# Patient Record
Sex: Female | Born: 1985
Health system: Southern US, Community
[De-identification: ages and names within clinical notes are randomized; demographics above are authoritative.]

## PROBLEM LIST (undated history)

## (undated) DIAGNOSIS — M40209 Unspecified kyphosis, site unspecified: Secondary | ICD-10-CM

## (undated) DIAGNOSIS — N83201 Unspecified ovarian cyst, right side: Secondary | ICD-10-CM

## (undated) DIAGNOSIS — R55 Syncope and collapse: Secondary | ICD-10-CM

## (undated) DIAGNOSIS — R5600 Simple febrile convulsions: Secondary | ICD-10-CM

## (undated) DIAGNOSIS — N83202 Unspecified ovarian cyst, left side: Secondary | ICD-10-CM

## (undated) DIAGNOSIS — C801 Malignant (primary) neoplasm, unspecified: Secondary | ICD-10-CM

## (undated) DIAGNOSIS — S060X9A Concussion with loss of consciousness of unspecified duration, initial encounter: Secondary | ICD-10-CM

## (undated) DIAGNOSIS — F82 Specific developmental disorder of motor function: Secondary | ICD-10-CM

## (undated) DIAGNOSIS — N390 Urinary tract infection, site not specified: Secondary | ICD-10-CM

## (undated) DIAGNOSIS — F809 Developmental disorder of speech and language, unspecified: Secondary | ICD-10-CM

## (undated) HISTORY — DX: Developmental disorder of speech and language, unspecified: F80.9

## (undated) HISTORY — DX: Specific developmental disorder of motor function: F82

## (undated) HISTORY — DX: Simple febrile convulsions: R56.00

## (undated) HISTORY — DX: Unspecified kyphosis, site unspecified: M40.209

## (undated) HISTORY — DX: Syncope and collapse: R55

## (undated) HISTORY — PX: WISDOM TOOTH EXTRACTION: SHX21

---
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
## 2007-04-17 ENCOUNTER — Ambulatory Visit: Payer: Self-pay | Admitting: Internal Medicine

## 2007-04-19 ENCOUNTER — Encounter: Admission: RE | Admit: 2007-04-19 | Discharge: 2007-05-05 | Payer: Self-pay | Admitting: Orthopaedic Surgery

## 2007-05-23 ENCOUNTER — Telehealth: Payer: Self-pay | Admitting: Internal Medicine

## 2007-07-10 ENCOUNTER — Telehealth: Payer: Self-pay | Admitting: *Deleted

## 2007-09-01 ENCOUNTER — Telehealth: Payer: Self-pay | Admitting: *Deleted

## 2007-11-21 ENCOUNTER — Telehealth: Payer: Self-pay | Admitting: Internal Medicine

## 2007-11-21 ENCOUNTER — Telehealth: Payer: Self-pay | Admitting: *Deleted

## 2008-08-06 ENCOUNTER — Telehealth: Payer: Self-pay | Admitting: Internal Medicine

## 2009-01-28 ENCOUNTER — Ambulatory Visit (HOSPITAL_COMMUNITY): Admission: RE | Admit: 2009-01-28 | Discharge: 2009-01-28 | Payer: Self-pay | Admitting: Neurology

## 2009-01-28 ENCOUNTER — Encounter: Payer: Self-pay | Admitting: Internal Medicine

## 2009-02-12 ENCOUNTER — Encounter: Payer: Self-pay | Admitting: Internal Medicine

## 2009-02-14 ENCOUNTER — Ambulatory Visit: Payer: Self-pay | Admitting: Internal Medicine

## 2009-02-14 DIAGNOSIS — R519 Headache, unspecified: Secondary | ICD-10-CM | POA: Insufficient documentation

## 2009-02-14 DIAGNOSIS — R55 Syncope and collapse: Secondary | ICD-10-CM | POA: Insufficient documentation

## 2009-02-14 DIAGNOSIS — R51 Headache: Secondary | ICD-10-CM | POA: Insufficient documentation

## 2009-09-01 ENCOUNTER — Telehealth: Payer: Self-pay | Admitting: Internal Medicine

## 2009-09-11 ENCOUNTER — Encounter: Payer: Self-pay | Admitting: Internal Medicine

## 2010-07-06 ENCOUNTER — Ambulatory Visit: Payer: Self-pay | Admitting: Internal Medicine

## 2010-07-06 ENCOUNTER — Other Ambulatory Visit: Admission: RE | Admit: 2010-07-06 | Discharge: 2010-07-06 | Payer: Self-pay | Admitting: Internal Medicine

## 2010-07-06 DIAGNOSIS — R030 Elevated blood-pressure reading, without diagnosis of hypertension: Secondary | ICD-10-CM | POA: Insufficient documentation

## 2010-07-06 LAB — CONVERTED CEMR LAB: HIV: NONREACTIVE

## 2010-07-07 LAB — CONVERTED CEMR LAB
ALT: 15 units/L (ref 0–35)
AST: 21 units/L (ref 0–37)
Albumin: 3.9 g/dL (ref 3.5–5.2)
Alkaline Phosphatase: 59 units/L (ref 39–117)
BUN: 20 mg/dL (ref 6–23)
Basophils Absolute: 0.1 10*3/uL (ref 0.0–0.1)
Basophils Relative: 1.2 % (ref 0.0–3.0)
Bilirubin, Direct: 0.1 mg/dL (ref 0.0–0.3)
CO2: 28 meq/L (ref 19–32)
Calcium: 8.9 mg/dL (ref 8.4–10.5)
Chloride: 99 meq/L (ref 96–112)
Cholesterol: 155 mg/dL (ref 0–200)
Creatinine, Ser: 0.8 mg/dL (ref 0.4–1.2)
Eosinophils Absolute: 0.1 10*3/uL (ref 0.0–0.7)
Eosinophils Relative: 1.5 % (ref 0.0–5.0)
GFR calc non Af Amer: 100.62 mL/min (ref >60)
Glucose, Bld: 81 mg/dL (ref 70–99)
HCT: 39.7 % (ref 36.0–46.0)
HDL: 65.2 mg/dL (ref >39.00)
Hemoglobin: 13.8 g/dL (ref 12.0–15.0)
LDL Cholesterol: 83 mg/dL (ref 0–99)
Lymphocytes Relative: 27.7 % (ref 12.0–46.0)
Lymphs Abs: 1.9 10*3/uL (ref 0.7–4.0)
MCHC: 34.8 g/dL (ref 30.0–36.0)
MCV: 92.9 fL (ref 78.0–100.0)
Monocytes Absolute: 0.6 10*3/uL (ref 0.1–1.0)
Monocytes Relative: 8.4 % (ref 3.0–12.0)
Neutro Abs: 4.3 10*3/uL (ref 1.4–7.7)
Neutrophils Relative %: 61.2 % (ref 43.0–77.0)
Platelets: 158 10*3/uL (ref 150.0–400.0)
Potassium: 3.8 meq/L (ref 3.5–5.1)
RBC: 4.27 M/uL (ref 3.87–5.11)
RDW: 13.2 % (ref 11.5–14.6)
Sodium: 139 meq/L (ref 135–145)
TSH: 0.65 microintl units/mL (ref 0.35–5.50)
Total Bilirubin: 1.1 mg/dL (ref 0.3–1.2)
Total CHOL/HDL Ratio: 2
Total Protein: 6.2 g/dL (ref 6.0–8.3)
Triglycerides: 33 mg/dL (ref 0.0–149.0)
VLDL: 6.6 mg/dL (ref 0.0–40.0)
WBC: 7 10*3/uL (ref 4.5–10.5)

## 2010-07-09 LAB — CONVERTED CEMR LAB: Pap Smear: NEGATIVE

## 2010-08-01 ENCOUNTER — Ambulatory Visit (HOSPITAL_COMMUNITY): Admission: RE | Admit: 2010-08-01 | Discharge: 2010-08-01 | Payer: Self-pay | Admitting: Emergency Medicine

## 2010-08-02 ENCOUNTER — Encounter: Payer: Self-pay | Admitting: Internal Medicine

## 2010-08-07 ENCOUNTER — Encounter: Payer: Self-pay | Admitting: Internal Medicine

## 2010-08-25 ENCOUNTER — Ambulatory Visit: Payer: Self-pay | Admitting: Internal Medicine

## 2010-08-25 DIAGNOSIS — R1084 Generalized abdominal pain: Secondary | ICD-10-CM | POA: Insufficient documentation

## 2010-08-25 DIAGNOSIS — N83209 Unspecified ovarian cyst, unspecified side: Secondary | ICD-10-CM | POA: Insufficient documentation

## 2010-08-25 DIAGNOSIS — R1031 Right lower quadrant pain: Secondary | ICD-10-CM | POA: Insufficient documentation

## 2010-08-25 LAB — CONVERTED CEMR LAB
Bilirubin Urine: NEGATIVE
Blood in Urine, dipstick: NEGATIVE
Glucose, Urine, Semiquant: NEGATIVE
Ketones, urine, test strip: NEGATIVE
Nitrite: NEGATIVE
Protein, U semiquant: NEGATIVE
Specific Gravity, Urine: 1.01
Urobilinogen, UA: 0.2
pH: 7

## 2010-08-26 ENCOUNTER — Encounter: Payer: Self-pay | Admitting: Internal Medicine

## 2010-10-06 NOTE — Miscellaneous (Signed)
Summary: Initial Evaluation for Treatment/[facility]  Initial Evaluation for Treatment/[facility]   Imported By: Maryln Gottron 09/15/2009 15:33:49  _____________________________________________________________________  External Attachment:    Type:   Image     Comment:   External Document

## 2010-10-06 NOTE — Assessment & Plan Note (Signed)
Summary: CPX (PT WILL COME IN FASTING FOR POTENTIAL LABS) / PAP // RS/...   Vital Signs:  Patient profile:   25 year old female Menstrual status:  regular LMP:     06/22/2010 Height:      69.75 inches Weight:      134 pounds BMI:     19.44 Pulse rate:   78 / minute BP sitting:   110 / 60  (right arm) Cuff size:   regular  Vitals Entered By: Romualdo Bolk, CMA (AAMA) (July 06, 2010 9:57 AM)  Serial Vital Signs/Assessments:  Time      Position  BP       Pulse  Resp  Temp     By                     118/70                         Madelin Headings MD  CC: CPX with pap LMP (date): 06/22/2010 LMP - Character: light Menarche (age onset years): 14   Menses interval (days): 28 Menstrual flow (days): 2 Enter LMP: 06/22/2010   History of Present Illness: Monique Frazier  comesin comes in today  for preventive visit and rx for her OCPs  which is progesterone only because of hx of  neurol symptoms  of a migraine  on combined ocps . she has done well with this.  Generally good health and no major concerns but ssays that when she donates  plasma   recently has had elvated bp readings int he 1130- 140 range and she thinks it is from anxiety when the cuff goes on.   has been normal at other places   No major injuries or illnes since last visit  otherwise.    Preventive Care Screening  Prior Values:    Last Tetanus Booster:  Historical (12/08/1999)   Preventive Screening-Counseling & Management  Alcohol-Tobacco     Alcohol drinks/day: <1     Alcohol type: spirits     Smoking Status: quit     Year Quit: 2008  Caffeine-Diet-Exercise     Caffeine use/day: <1     Does Patient Exercise: yes     Type of exercise: cardio, wts,abds push ups  Hep-HIV-STD-Contraception     Dental Visit-last 6 months yes     Sun Exposure-Excessive: no  Safety-Violence-Falls     Seat Belt Use: yes     Firearms in the Home: no firearms in the home     Smoke Detectors: yes  Current Medications  (verified): 1)  Camila 0.35 Mg Tabs (Norethindrone (Contraceptive)) .Marland Kitchen.. 1 By Mouth Once Daily  Allergies (verified): No Known Drug Allergies  Past History:  Past Medical History: Febrile seizure at age 74.5 years Delayed Motor and speech skills. Premature birth by 4 weeks Twin pregnancy   5# 5oz  Kyphosis  mild  consult Dr Noel Gerold  in past  Syncope with neg ekg and eeg  felt to be  Migraines  to avoid estrogen based  hormones  Past History:  Care Management: Neurology: [facility]    PMH-FH-SH reviewed-no changes except otherwise noted  Social History: working somtime  Biochemist, clinical.  sports Information systems manager. Duplex with  roommate .   cat in  [facility]  No ets or pets.  Ocass caffiene .  Exercising  bike and cardio  and weights.  1-2 night per week  alcohol Seat Belt Use:  yes Dental Care w/in 6 mos.:  yes Sun Exposure-Excessive:  no  Review of Systems  The patient denies anorexia, fever, vision loss, decreased hearing, hoarseness, chest pain, syncope, dyspnea on exertion, prolonged cough, abdominal pain, melena, hematochezia, muscle weakness, transient blindness, difficulty walking, depression, abnormal bleeding, enlarged lymph nodes, and angioedema.    Physical Exam  General:  Well-developed,well-nourished,in no acute distress; alert,appropriate and cooperative throughout examination Head:  Normocephalic and atraumatic without obvious abnormalities. No apparent alopecia or balding. Eyes:  PERRL, EOMs full, conjunctiva clear  Ears:  R ear normal, L ear normal, and no external deformities.   Nose:  no external deformity, no external erythema, and no nasal discharge.   Mouth:  good dentition and pharynx pink and moist.   Neck:  No deformities, masses, or tenderness noted. Chest Wall:  No deformities, masses, or tenderness noted. Breasts:  No mass, nodules, thickening, tenderness, bulging, retraction, inflamation, nipple  discharge or skin changes noted.   Lungs:  Normal respiratory effort, chest expands symmetrically. Lungs are clear to auscultation, no crackles or wheezes.no dullness.   Heart:  Normal rate and regular rhythm. S1 and S2 normal without gallop, murmur, click, rub or other extra sounds.no lifts.   Abdomen:  Bowel sounds positive,abdomen soft and non-tender without masses, organomegaly or hernias noted. Genitalia:  Pelvic Exam:        External: normal female genitalia without lesions or massescreamy discharge grey         Vagina: normal without lesions or masses        Cervix: normal without lesions or masses        Adnexa: normal bimanual exam without masses or fullness        Uterus: normal by palpation        Pap smear: performed Msk:  no joint warmth, no redness over joints, and no joint deformities.   Pulses:  pulses intact without delay   Extremities:  no clubbing cyanosis or edema  Neurologic:  alert & oriented X3, strength normal in all extremities, gait normal, and DTRs symmetrical and normal.   Skin:  turgor normal, color normal, no ecchymoses, and no petechiae.   Cervical Nodes:  No lymphadenopathy noted Axillary Nodes:  No palpable lymphadenopathy Inguinal Nodes:  No significant adenopathy Psych:  Oriented X3, memory intact for recent and remote, normally interactive, good eye contact, and not depressed appearing.  minimally anxious   Impression & Recommendations:  Problem # 1:  PREVENTIVE HEALTH CARE (ICD-V70.0) Discussed nutrition,exercise,diet,healthy weight, vitamin D and calcium.  Orders: TLB-TSH (Thyroid Stimulating Hormone) (84443-TSH) TLB-Hepatic/Liver Function Pnl (80076-HEPATIC) TLB-CBC Platelet - w/Differential (85025-CBCD) TLB-BMP (Basic Metabolic Panel-BMET) (80048-METABOL) TLB-Lipid Panel (80061-LIPID) T-HIV Antibody  (Reflex) (66063-01601) T-RPR (Syphilis) (09323-55732) Venipuncture (20254) Specimen Handling (27062)  Problem # 2:  ROUTINE GYNECOLOGICAL EXAM  (ICD-V72.31) pap done  sti screen  Problem # 3:  MIGRAINE HEADACHE W SYNCOPE (ICD-346.90) to avoid estrogen ocps  per neuro     Problem # 4:  ELEVATED BLOOD PRESSURE WITHOUT DIAGNOSIS OF HYPERTENSION (ICD-796.2) Assessment: New seems to be anxiety situational as bp is good here today    letter done for   patient   Complete Medication List: 1)  Camila 0.35 Mg Tabs (Norethindrone (contraceptive)) .Marland Kitchen.. 1 by mouth once daily  Other Orders: Admin 1st Vaccine (37628) Flu Vaccine 80yrs + (31517)  Patient Instructions: 1)  You will be informed of lab pap  results when available.  2)  yearly checks or as needed. Prescriptions: CAMILA 0.35 MG TABS (NORETHINDRONE (CONTRACEPTIVE)) 1 by mouth once daily  #1 pack x 11   Entered and Authorized by:   Madelin Headings MD   Signed by:   Madelin Headings MD on 07/06/2010   Method used:   Electronically to        [facility]. [phone]* (retail)       [address]       [place], [place]  [address]       Ph: [phone]       Fax: [phone]   RxID:   [id number]    Orders Added: 1)  Admin 1st Vaccine [90471] 2)  Flu Vaccine 22yrs + [44010] 3)  TLB-TSH (Thyroid Stimulating Hormone) [84443-TSH] 4)  TLB-Hepatic/Liver Function Pnl [80076-HEPATIC] 5)  TLB-CBC Platelet - w/Differential [85025-CBCD] 6)  TLB-BMP (Basic Metabolic Panel-BMET) [80048-METABOL] 7)  TLB-Lipid Panel [80061-LIPID] 8)  T-HIV Antibody  (Reflex) [27253-66440] 9)  T-RPR (Syphilis) [34742-59563] 10)  Venipuncture [87564] 11)  Specimen Handling [99000] 12)  Est. Patient 18-39 years [99395] 13)  Est. Patient Level II [33295]   Flu Vaccine Consent Questions     Do you have a history of severe allergic reactions to this vaccine? no    Any prior history of allergic reactions to egg and/or gelatin? no    Do you have a sensitivity to the preservative Thimersol? no    Do you have a past history of Guillan-Barre Syndrome? no    Do you currently  have an acute febrile illness? no    Have you ever had a severe reaction to latex? no    Vaccine information given and explained to patient? yes    Are you currently pregnant? no    Lot Number:[id number]   Exp Date:03/06/2011   Site Given  Left Deltoid IM  .lbflu Romualdo Bolk, CMA (AAMA)  July 06, 2010 10:00 AM

## 2010-10-06 NOTE — Letter (Signed)
Summary: Generic Letter  [place] [facility]  [address] [place], [place] [address]   Phone: [phone]  Fax: [phone]    07/06/2010  Monique Frazier [address] [place], [place]  [address]  The above person is a patient in our practice. She is in good health and her blood pressure is normal.  No medical reason to exclude from blood product donation.        Sincerely,   Berniece Andreas MD

## 2010-10-07 ENCOUNTER — Encounter: Payer: Self-pay | Admitting: Emergency Medicine

## 2010-10-08 NOTE — Assessment & Plan Note (Signed)
Summary: UTI?DM   Vital Signs:  Patient profile:   25 year old female Menstrual status:  regular LMP:     08/11/2010 Weight:      137 pounds Temp:     98.2 degrees F oral Pulse rate:   66 / minute BP sitting:   100 / 60  (right arm) Cuff size:   regular  Vitals Entered By: Romualdo Bolk, CMA (AAMA) (August 25, 2010 4:15 PM) CC: Pt had an ovarian cyst which she took doxy for 10 days. Pt is having Rt lower quad pain that is sharp at time. Pt also had a yeast infection x 2 days over the weekend and went away. Pt didn't treat it. No fever. No burning upon urination. LMP (date): 08/11/2010 LMP - Character: light Menarche (age onset years): 14   Menses interval (days): 28 Menstrual flow (days): 2 Enter LMP: 08/11/2010 Last PAP Result NEGATIVE FOR INTRAEPITHELIAL LESIONS OR MALIGNANCY. BENIGN REACTIVE/REPARATIVE CHANGES.   History of Present Illness: Monique Frazier comes in today  foracute visit but this is a recurrance of .signs  evaluated at urgent care  and [facility] earlier in [place].  was seen in urgen care with abd pain rlq and had elevated wbc 18k pyuria    with neg sti  screen   was rx with antibioitc and noted to have a n ovarian cyst on the right   .   was seen  by gyne who felt  getting bette and to fu if pain recurred.  She was feeling better at end of docy early dec  and was well until a few days ago until the onset again of serious intermittent pain on right lower abdomen.   she had some whit itchy clumpy dc last week but resolved.    No rx for yeast . periods on time and no missed ocps.  she comes in today for a same day appt for eval.  Preventive Screening-Counseling & Management  Alcohol-Tobacco     Alcohol drinks/day: <1     Alcohol type: spirits     Smoking Status: quit     Year Quit: 2008  Caffeine-Diet-Exercise     Caffeine use/day: <1     Does Patient Exercise: yes     Type of exercise: cardio, wts,abds push ups  Current Medications  (verified): 1)  Camila 0.35 Mg Tabs (Norethindrone (Contraceptive)) .Marland Kitchen.. 1 By Mouth Once Daily  Allergies (verified): No Known Drug Allergies  Past History:  Past medical, surgical, family and social histories (including risk factors) reviewed, and no changes noted (except as noted below).  Past Medical History: Reviewed history from 07/06/2010 and no changes required. Febrile seizure at age 33.5 years Delayed Motor and speech skills. Premature birth by 4 weeks Twin pregnancy   5# 5oz  Kyphosis  mild  consult Dr Noel Gerold  in past  Syncope with neg ekg and eeg  felt to be  Migraines  to avoid estrogen based  hormones  Past Surgical History: Reviewed history from 04/20/2007 and no changes required. Denies surgical history  Past History:  Care Management: Neurology: [facility] over thanksgiving weekend [facility]  12  12      Family History: Reviewed history from 02/14/2009 and no changes required. Father: Healthy Mother: Healthy Siblings: Twin Brother- 1 kidney  Neg for SCD arrythmia .   Social History: Reviewed history from 07/06/2010 and no changes required. 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   cat in  [facility]  No ets or pets.  Ocass caffiene .  Exercising  bike and cardio and weights.  1-2 night per week  alcohol   Review of Systems       The patient complains of abdominal pain.  The patient denies anorexia, fever, weight loss, weight gain, vision loss, decreased hearing, prolonged cough, hematochezia, severe indigestion/heartburn, hematuria, incontinence, genital sores, muscle weakness, difficulty walking, abnormal bleeding, enlarged lymph nodes, and angioedema.         see hpi  Physical Exam  General:  Well-developed,well-nourished,in no acute distress; alert,appropriate and cooperative throughout examination Head:  normocephalic and atraumatic.    Eyes:  vision grossly intact, pupils equal, and pupils round.   Neck:  No deformities, masses, or tenderness noted. Lungs:  normal respiratory effort, no intercostal retractions, no accessory muscle use, and normal breath sounds.   Heart:  Normal rate and regular rhythm. S1 and S2 normal without gallop, murmur, click, rub or other extra sounds. Abdomen:  soft, no masses, no guarding, no rigidity, no abdominal hernia, no inguinal hernia, no hepatomegaly, and no splenomegaly.  low right pelvic tenderness no psoas sign   Pulses:  pulses intact without delay   Extremities:  no clubbing cyanosis or edema  Skin:  turgor normal, color normal, no ecchymoses, and no petechiae.   Cervical Nodes:  No lymphadenopathy noted Psych:  Oriented X3, normally interactive, good eye contact, not anxious appearing, and not depressed appearing.   reviewed 2 sets of records available  Impression & Recommendations:  Problem # 1:  ABDOMINAL PAIN, RIGHT LOWER QUADRANT (ICD-789.03)  mild now but  in same placae as prev pain felt to be from cyst but rx for nfection  mild pyuria  will culture and rx empirically  had neg sti check in the past month x 2   Orders: UA Dipstick w/o Micro (automated)  (81003)  Problem # 2:  OVARIAN CYST, RIGHT (ICD-620.2) needs follow up  ..  rec  gyne consult this week and poss  uuuUS imaging  . will do referral   Complete Medication List: 1)  Camila 0.35 Mg Tabs (Norethindrone (contraceptive)) .Marland Kitchen.. 1 by mouth once daily 2)  Ciprofloxacin Hcl 500 Mg Tabs (Ciprofloxacin hcl) .Marland Kitchen.. 1 by mouth two times a day  Other Orders: T-Culture, Urine (63875-64332) Gynecologic Referral (Gyn)  Patient Instructions: 1)  begin antibioitc for possible bladder infection. 2)  can use monistat  if getting  yeast infection  symptoms  vaginal cream or suppository for 3 nights.  3)  Ned appt with the gyne  this week  . call in the meant time  if worse. ok to use the ibuprofen.  in the meantime   Prescriptions: CIPROFLOXACIN HCL 500 MG TABS (CIPROFLOXACIN HCL) 1 by mouth two times a day  #10 x 0   Entered and Authorized by:   Madelin Headings MD   Signed by:   Madelin Headings MD on 08/25/2010   Method used:   Electronically to        [facility]. [phone]* (retail)       [address]       [place], [place]  [address]       Ph: [phone]       Fax: [phone]   RxID:   [id number]    Orders Added: 1)  T-Culture, Urine [[id number]] 2)  Gynecologic Referral [Gyn] 3)  UA Dipstick w/o Micro (automated)  [81003] 4)  Est. Patient Level IV [14782]    Laboratory Results   Urine Tests    Routine Urinalysis   Color: yellow Appearance: Clear Glucose: negative   (Normal Range: Negative) Bilirubin: negative   (Normal Range: Negative) Ketone: negative   (Normal Range: Negative) Spec. Gravity: 1.010   (Normal Range: 1.003-1.035) Blood: negative   (Normal Range: Negative) pH: 7.0   (Normal Range: 5.0-8.0) Protein: negative   (Normal Range: Negative) Urobilinogen: 0.2   (Normal Range: 0-1) Nitrite: negative   (Normal Range: Negative) Leukocyte Esterace: 1+   (Normal Range: Negative)    Comments: Rita Ohara  August 25, 2010 4:04 PM

## 2010-10-08 NOTE — Letter (Signed)
Summary: [facility]  [facility]   Imported By: Maryln Gottron 09/02/2010 14:51:37  _____________________________________________________________________  External Attachment:    Type:   Image     Comment:   External Document

## 2010-10-08 NOTE — Letter (Signed)
Summary: [facility]  [facility]   Imported By: Maryln Gottron 09/02/2010 14:58:35  _____________________________________________________________________  External Attachment:    Type:   Image     Comment:   External Document

## 2011-03-08 ENCOUNTER — Ambulatory Visit (INDEPENDENT_AMBULATORY_CARE_PROVIDER_SITE_OTHER): Payer: BC Managed Care – PPO | Admitting: Internal Medicine

## 2011-03-08 ENCOUNTER — Encounter: Payer: Self-pay | Admitting: Internal Medicine

## 2011-03-08 VITALS — BP 100/60 | HR 72 | Wt 140.0 lb

## 2011-03-08 DIAGNOSIS — Z8679 Personal history of other diseases of the circulatory system: Secondary | ICD-10-CM

## 2011-03-08 DIAGNOSIS — Z8669 Personal history of other diseases of the nervous system and sense organs: Secondary | ICD-10-CM

## 2011-03-08 DIAGNOSIS — Z3009 Encounter for other general counseling and advice on contraception: Secondary | ICD-10-CM

## 2011-03-08 DIAGNOSIS — N926 Irregular menstruation, unspecified: Secondary | ICD-10-CM | POA: Insufficient documentation

## 2011-03-08 DIAGNOSIS — R55 Syncope and collapse: Secondary | ICD-10-CM

## 2011-03-08 LAB — POCT URINALYSIS DIPSTICK
Bilirubin, UA: NEGATIVE
Glucose, UA: NEGATIVE
Ketones, UA: NEGATIVE
Leukocytes, UA: NEGATIVE
Nitrite, UA: NEGATIVE
Protein, UA: NEGATIVE
Spec Grav, UA: 1.025
Urobilinogen, UA: 1
pH, UA: 5.5

## 2011-03-08 LAB — POCT URINE PREGNANCY: Preg Test, Ur: NEGATIVE

## 2011-03-08 NOTE — Progress Notes (Signed)
  Subjective:    Patient ID: Monique Frazier, female    DOB: 08/18/1986, 25 y.o.   MRN: [id number]  HPI Patient comes in today for sda because of problem with vaginal bleeding irreg that is a change from reg pattern.  She has been on Camila because of hx of syncopal type migraine and was re she not take combined estrogen pills.  Brown  And red off and on for the last month and not a lot of cramps.  Very good about taking med  On time  No new partners .Marland Kitchen  Review of Systems UTi couple months ago.   rx with ? Med no vaginal dc abd pain or sores. No fever weight changes Past history family history social history reviewed in the electronic medical record.  To be abstracted ( not done yet )      Objective:   Physical Exam Physical Exam: Vital signs reviewed RSW:NIOE is a well-developed well-nourished alert cooperative  white female who appears her stated age in no acute distress.  HEENT: normocephalic  traumatic , Eyes: PERRL EOM's full, conjunctiva clear, Nares: paten,t no deformity discharge or tenderness., Moist mucous membranes. Dentition in adequate repair. NECK: supple without masses, thyromegaly or bruits. CHEST/PULM:  Clear to auscultation and percussion breath sounds equal no wheeze , rales or rhonchi. No chest wall deformities or tenderness. CV: PMI is nondisplaced, S1 S2 no gallops, murmurs, rubs. Peripheral pulses are full without delay.No JVD .  ABDOMEN: Bowel sounds normal nontender  No guard or rebound, no hepato splenomegal no CVA tenderness.   Extremtities:  No clubbing cyanosis or edema, no acute joint swelling or redness no focal atrophy NEURO:  Oriented x3, cranial nerves 3-12 appear to be intact, no obvious focal weakness,gait within normal limits no abnormal reflexes or asymmetrical SKIN: No acute rashes normal turgor, color, no bruising or petechiae. PSYCH: Oriented, good eye contact, no obvious depression anxiety, cognition and judgment appear normal.        Assessment & Plan:  irreg bleeding   On camila. R/O infection .Marland Kitchen    Low risk    Poss from  prog only pill  Hx of atypical migraines syncopal seizure like  Contraceptive counseling   Consider mirena   Disc with her Gyne .

## 2011-03-08 NOTE — Patient Instructions (Signed)
Will notify you  of labs when available. THis may just be break through bleeding  That will clear up on its own. Consider  Myrena iud. If bleeding continues irreg then see your GYNE. m

## 2011-03-09 LAB — GC/CHLAMYDIA PROBE AMP, URINE
Chlamydia, Swab/Urine, PCR: NEGATIVE
GC Probe Amp, Urine: NEGATIVE

## 2011-03-11 ENCOUNTER — Encounter: Payer: Self-pay | Admitting: Internal Medicine

## 2011-03-11 ENCOUNTER — Telehealth: Payer: Self-pay | Admitting: *Deleted

## 2011-03-11 DIAGNOSIS — Z3009 Encounter for other general counseling and advice on contraception: Secondary | ICD-10-CM | POA: Insufficient documentation

## 2011-03-11 DIAGNOSIS — Z8669 Personal history of other diseases of the nervous system and sense organs: Secondary | ICD-10-CM | POA: Insufficient documentation

## 2011-03-11 NOTE — Telephone Encounter (Signed)
Pt aware of results 

## 2011-03-11 NOTE — Telephone Encounter (Signed)
Left message on machine about results. 

## 2011-03-11 NOTE — Telephone Encounter (Signed)
Message copied by Romualdo Bolk on Thu Mar 11, 2011  8:44 AM ------      Message from: [facility] [place] [facility]      Created: Tue Mar 09, 2011  7:06 PM       Tell Secilia all tests are negative . See her gyne if continuing

## 2011-03-12 ENCOUNTER — Encounter: Payer: Self-pay | Admitting: Internal Medicine

## 2011-03-24 ENCOUNTER — Telehealth: Payer: Self-pay

## 2011-03-24 DIAGNOSIS — R300 Dysuria: Secondary | ICD-10-CM

## 2011-03-24 NOTE — Telephone Encounter (Signed)
Pt c/o UTI. Dysuria and burning. Can not make it to office today. Would like to know if something can be called in for her or does she need to schedule an appointment

## 2011-03-24 NOTE — Telephone Encounter (Signed)
Patient has other rx utis  Rec UA and cx before treatment  Can come any time for spec so we can rx.

## 2011-03-24 NOTE — Telephone Encounter (Signed)
Per Dr. Fabian Sharp- make sure she doesn't have a fever and get her symptoms. Have pt come by tomorrow for a ua and culture. Then we can call something in.

## 2011-03-24 NOTE — Telephone Encounter (Signed)
Left message for patient to call back  

## 2011-03-24 NOTE — Telephone Encounter (Signed)
Pt denies fever and says she will come into the office tomorrow on her lunch break to give a urine sample

## 2011-03-25 ENCOUNTER — Other Ambulatory Visit: Payer: BC Managed Care – PPO

## 2011-03-25 DIAGNOSIS — N390 Urinary tract infection, site not specified: Secondary | ICD-10-CM

## 2011-03-25 LAB — POCT URINALYSIS DIPSTICK
Bilirubin, UA: NEGATIVE
Blood, UA: NEGATIVE
Glucose, UA: NEGATIVE
Ketones, UA: NEGATIVE
Nitrite, UA: NEGATIVE
Protein, UA: NEGATIVE
Spec Grav, UA: 1.005
Urobilinogen, UA: 0.2
pH, UA: 6.5

## 2011-03-26 ENCOUNTER — Telehealth: Payer: Self-pay | Admitting: *Deleted

## 2011-03-26 NOTE — Telephone Encounter (Signed)
Spoke to pt- she is not having any symptoms now. Pt took cranberry pills and that helped it. Pt to call us back if she gets anymore symptoms and we can get a culture then.

## 2011-03-26 NOTE — Telephone Encounter (Signed)
Labs failed to do a urine culture. Need to know what her symptoms are. Left message to call back.

## 2011-03-26 NOTE — Progress Notes (Signed)
See phone note

## 2011-08-12 ENCOUNTER — Encounter: Payer: Self-pay | Admitting: Family Medicine

## 2011-08-12 ENCOUNTER — Ambulatory Visit (INDEPENDENT_AMBULATORY_CARE_PROVIDER_SITE_OTHER): Payer: BC Managed Care – PPO | Admitting: Family Medicine

## 2011-08-12 VITALS — BP 120/80 | Temp 98.0°F | Wt 145.0 lb

## 2011-08-12 DIAGNOSIS — B349 Viral infection, unspecified: Secondary | ICD-10-CM

## 2011-08-12 DIAGNOSIS — B9789 Other viral agents as the cause of diseases classified elsewhere: Secondary | ICD-10-CM

## 2011-08-12 MED ORDER — HYDROCODONE-HOMATROPINE 5-1.5 MG/5ML PO SYRP: 5.0000 mL | ORAL_SOLUTION | Freq: Four times a day (QID) | ORAL | Status: AC | PRN

## 2011-08-12 NOTE — Progress Notes (Signed)
  Subjective:    Patient ID: Monique Frazier, female    DOB: September 15, 1985, 25 y.o.   MRN: [id number]  HPI  Acute visit. Onset 2 days ago sore throat. Sore throat now resolved. Now has intermittent headache, nasal congestion, and mostly dry cough. Cough not relieved with over-the-counter medications. No history of reported asthma. No wheezing. Denies fever or chills. No nausea, vomiting, or diarrhea. Headache improved with ibuprofen. Nonsmoker. Very active and exercises regularly   Review of Systems  Constitutional: Negative for fever and chills.  HENT: Positive for congestion and sinus pressure. Negative for sore throat and neck stiffness.   Respiratory: Positive for cough. Negative for shortness of breath and wheezing.   Cardiovascular: Negative for chest pain.  Skin: Negative for rash.  Neurological: Positive for headaches.       Objective:   Physical Exam  Constitutional: She appears well-developed and well-nourished. No distress.  HENT:  Right Ear: External ear normal.  Left Ear: External ear normal.  Mouth/Throat: Oropharynx is clear and moist.  Neck: Neck supple.  Cardiovascular: Normal rate and regular rhythm.   Pulmonary/Chest: Effort normal and breath sounds normal. No respiratory distress. She has no wheezes. She has no rales.  Musculoskeletal: She exhibits no edema.  Lymphadenopathy:    She has no cervical adenopathy.          Assessment & Plan:  Viral syndrome. Hycodan for as needed use of cough. Followup promptly for any fever or worsening symptoms

## 2011-08-12 NOTE — Patient Instructions (Signed)
Viral Syndrome You or your child has Viral Syndrome. It is the most common infection causing "colds" and infections in the nose, throat, sinuses, and breathing tubes. Sometimes the infection causes nausea, vomiting, or diarrhea. The germ that causes the infection is a virus. No antibiotic or other medicine will kill it. There are medicines that you can take to make you or your child more comfortable.  HOME CARE INSTRUCTIONS   Rest in bed until you start to feel better.   If you have diarrhea or vomiting, eat small amounts of crackers and toast. Soup is helpful.   Do not give aspirin or medicine that contains aspirin to children.   Only take over-the-counter or prescription medicines for pain, discomfort, or fever as directed by your caregiver.  SEEK IMMEDIATE MEDICAL CARE IF:   You or your child has not improved within one week.   You or your child has pain that is not at least partially relieved by over-the-counter medicine.   Thick, colored mucus or blood is coughed up.   Discharge from the nose becomes thick yellow or green.   Diarrhea or vomiting gets worse.   There is any major change in your or your child's condition.   You or your child develops a skin rash, stiff neck, severe headache, or are unable to hold down food or fluid.   You or your child has an oral temperature above 102 F (38.9 C), not controlled by medicine.   Your baby is older than 3 months with a rectal temperature of 102 F (38.9 C) or higher.   Your baby is 3 months old or younger with a rectal temperature of 100.4 F (38 C) or higher.  Document Released: 08/08/2006 Document Revised: 05/05/2011 Document Reviewed: 08/09/2007 Exit[employer] Patient Information 2012 [employer]. 

## 2011-11-08 ENCOUNTER — Emergency Department (HOSPITAL_COMMUNITY)
Admission: EM | Admit: 2011-11-08 | Discharge: 2011-11-08 | Disposition: A | Discharge status: Home / Self Care | Payer: BC Managed Care – PPO | Source: Home / Self Care | Attending: Family Medicine | Admitting: Family Medicine

## 2011-11-08 ENCOUNTER — Encounter (HOSPITAL_COMMUNITY): Payer: Self-pay | Admitting: *Deleted

## 2011-11-08 ENCOUNTER — Telehealth: Payer: Self-pay | Admitting: Internal Medicine

## 2011-11-08 DIAGNOSIS — N390 Urinary tract infection, site not specified: Secondary | ICD-10-CM

## 2011-11-08 HISTORY — DX: Urinary tract infection, site not specified: N39.0

## 2011-11-08 LAB — POCT PREGNANCY, URINE: Preg Test, Ur: NEGATIVE

## 2011-11-08 LAB — POCT URINALYSIS DIP (DEVICE)
Bilirubin Urine: NEGATIVE
Glucose, UA: NEGATIVE mg/dL
Ketones, ur: NEGATIVE mg/dL
Nitrite: POSITIVE — AB
Protein, ur: NEGATIVE mg/dL
Specific Gravity, Urine: <=1.005 (ref 1.005–1.030)
Urobilinogen, UA: 0.2 mg/dL (ref 0.0–1.0)
pH: 6 (ref 5.0–8.0)

## 2011-11-08 MED ORDER — PHENAZOPYRIDINE HCL 200 MG PO TABS: 200.0000 mg | ORAL_TABLET | Freq: Three times a day (TID) | ORAL | Status: AC | PRN

## 2011-11-08 MED ORDER — CEPHALEXIN 500 MG PO CAPS: 500.0000 mg | ORAL_CAPSULE | Freq: Four times a day (QID) | ORAL | Status: DC

## 2011-11-08 NOTE — Telephone Encounter (Signed)
Pt called and has ov tomorrow morning with pcp re: uti. Pt says that she is in a lot of pain and is wondering what she can take otc for pain?

## 2011-11-08 NOTE — ED Notes (Signed)
C/O dysuria since Sat afternoon.  Started w/ hematuria today.  Denies fevers or n/v.  Has had some low abd pain today, but denies flank pain.

## 2011-11-08 NOTE — Telephone Encounter (Signed)
Per Dr. Fabian Sharp pt can try pyridium 200 mg take 1 tid #12 with 0 rf.  Left a message for pt to return call.

## 2011-11-08 NOTE — Telephone Encounter (Signed)
Pt aware rx sent to [facility] and not to take medication right before coming to appt on 11/09/11.

## 2011-11-08 NOTE — ED Provider Notes (Addendum)
History     CSN: [id number]  Arrival date & time 11/08/11  1721   First MD Initiated Contact with Patient 11/08/11 1726      Chief Complaint  Patient presents with  . Urinary Tract Infection    (Consider location/radiation/quality/duration/timing/severity/associated sxs/prior treatment) Patient is a 26 y.o. female presenting with frequency. The history is provided by the patient.  Urinary Frequency This is a new problem. The current episode started 2 days ago. The problem has been gradually worsening. Pertinent negatives include no abdominal pain.    Past Medical History  Diagnosis Date  . Febrile seizure     at age 23.5 yrs  . Motor skills developmental delay   . Speech delay   . Premature birth     by 4 weeks twin pregnancy  . Kyphosis     mild consult Dr. Noel Gerold in past  . Syncope     with neg ekg and eeg felt to be  . Migraines     to avoid estrogen based hormones  . UTI (urinary tract infection)     History reviewed. No pertinent past surgical history.  Family History  Problem Relation Age of Onset  . Single kidney Brother     History  Substance Use Topics  . Smoking status: Never Smoker   . Smokeless tobacco: Not on file  . Alcohol Use: Yes     socially    OB History    Grav Para Term Preterm Abortions TAB SAB Ect Mult Living                  Review of Systems  Constitutional: Negative for fever and chills.  Gastrointestinal: Negative.  Negative for abdominal pain.  Genitourinary: Positive for dysuria, urgency, frequency and hematuria. Negative for vaginal bleeding, vaginal discharge and vaginal pain.  Musculoskeletal: Negative for back pain.    Allergies  Review of patient's allergies indicates no known allergies.  Home Medications   Current Outpatient Rx  Name Route Sig Dispense Refill  . LEVONORGESTREL 20 MCG/24HR IU IUD Intrauterine 1 each by Intrauterine route once.      . CEPHALEXIN 500 MG PO CAPS Oral Take 1 capsule (500 mg total) by  mouth 4 (four) times daily. Take all of medicine and drink lots of fluids 20 capsule 0  . PHENAZOPYRIDINE HCL 200 MG PO TABS Oral Take 1 tablet (200 mg total) by mouth 3 (three) times daily as needed for pain. 12 tablet 0  . PHENAZOPYRIDINE HCL 200 MG PO TABS Oral Take 1 tablet (200 mg total) by mouth 3 (three) times daily as needed for pain. 10 tablet 0    BP 131/83  Pulse 70  Temp(Src) 98.7 F (37.1 C) (Oral)  Resp 16  SpO2 100%  LMP 10/04/2011  Physical Exam  Nursing note and vitals reviewed. Constitutional: She is oriented to person, place, and time. She appears well-developed and well-nourished.  HENT:  Head: Normocephalic.  Abdominal: Soft. Bowel sounds are normal. There is no hepatosplenomegaly. There is tenderness in the suprapubic area. There is no CVA tenderness.  Neurological: She is alert and oriented to person, place, and time.  Skin: Skin is warm and dry.    ED Course  Procedures (including critical care time)  Labs Reviewed  POCT URINALYSIS DIP (DEVICE) - Abnormal; Notable for the following:    Hgb urine dipstick LARGE (*)    Nitrite POSITIVE (*)    Leukocytes, UA MODERATE (*) Biochemical Testing Only. Please order routine urinalysis from  main lab if confirmatory testing is needed.   All other components within normal limits  POCT PREGNANCY, URINE   No results found.   1. UTI (lower urinary tract infection)       MDM         Barkley Bruns, MD 11/08/11 1906  Barkley Bruns, MD 11/08/11 [phone]

## 2011-11-08 NOTE — Discharge Instructions (Signed)
Take all of medicine as directed, drink lots of fluids, see your doctor if further problems. °

## 2011-11-09 ENCOUNTER — Ambulatory Visit: Payer: BC Managed Care – PPO | Admitting: Internal Medicine

## 2011-11-09 ENCOUNTER — Telehealth: Payer: Self-pay | Admitting: *Deleted

## 2011-11-09 NOTE — Telephone Encounter (Signed)
Put on qid keflex for 5 days from Urgent care ed. ua showed nitrites positive ? If cx done

## 2011-11-09 NOTE — Telephone Encounter (Signed)
Called to see how pt is doing. Pt states that she is better since she started the antibiotic. I asked pt if they done a urine culture and she said that they did. I advised pt if she gets worse to call us and let us know if there is anything that we can do.

## 2011-11-15 ENCOUNTER — Telehealth: Payer: Self-pay | Admitting: Family Medicine

## 2011-11-15 ENCOUNTER — Ambulatory Visit (INDEPENDENT_AMBULATORY_CARE_PROVIDER_SITE_OTHER): Payer: BC Managed Care – PPO | Admitting: Internal Medicine

## 2011-11-15 ENCOUNTER — Encounter: Payer: Self-pay | Admitting: Internal Medicine

## 2011-11-15 VITALS — BP 120/60 | HR 78 | Temp 98.6°F | Wt 147.0 lb

## 2011-11-15 DIAGNOSIS — N390 Urinary tract infection, site not specified: Secondary | ICD-10-CM | POA: Insufficient documentation

## 2011-11-15 DIAGNOSIS — R300 Dysuria: Secondary | ICD-10-CM

## 2011-11-15 LAB — POCT URINALYSIS DIPSTICK
Bilirubin, UA: NEGATIVE
Glucose, UA: NEGATIVE
Ketones, UA: NEGATIVE
Nitrite, UA: NEGATIVE
Protein, UA: NEGATIVE
Spec Grav, UA: 1.015
Urobilinogen, UA: 1
pH, UA: 7.5

## 2011-11-15 MED ORDER — SULFAMETHOXAZOLE-TRIMETHOPRIM 800-160 MG PO TABS: 1.0000 | ORAL_TABLET | Freq: Two times a day (BID) | ORAL | Status: DC

## 2011-11-15 NOTE — Patient Instructions (Signed)
Will call with culture results . Call Thursday if not better or  As needed

## 2011-11-15 NOTE — Progress Notes (Signed)
  Subjective:    Patient ID: Monique Frazier, female    DOB: [birth year], 26 y.o.   MRN: [id number]  HPI  Patient comes in Septra DS 1 by mouth twice a day for 3 days culture done today although may be SDA for  new problem evaluation. Actually she was seen by the cousin urgent care last week for acute onset of dysuria and UTI symptoms. She had positive nitrites on her urine exam. She was placed on Keflex 500 mg 4 times a day and just finished it yesterday.  She still has some symptoms but is about 80% better. Still has some dysuria and abdominal discomfort. No fever or flank pain.  She did pretty well and taking the Keflex 4 times a day No GU symptoms otherwise Review of Systems Negative for chest pain shortness of breath unusual vaginal symptoms.  Past history family history social history reviewed in the electronic medical record.     Objective:   Physical Exam WDWN   In nad  Abdomen:  Sof,t normal bowel sounds without hepatosplenomegaly, no guarding rebound or masses no CVA tenderness UA 1+ blood  trc leuk   DATA REVIEWED: Urgent care visit   No culture is noted      Assessment & Plan:  Partially treated UTI Significantly better but urine is still abnormal and has some symptoms.  Was treated with Keflex 4 times a day  Given Septra x3 days urine culture done although may not be that helpful.  Discussed situation. call us if not better in 4 days.

## 2011-11-15 NOTE — Telephone Encounter (Signed)
[address] [place], [place] [address] p. [phone] f. [phone] To: [facility] Fax: [phone] From: [facility] Date/ Time: 11/14/2011 6:36 PM Taken By: Elease Giles, CSR Caller: Monique Frazier Facility: not collected Patient: Monique Frazier, Monique Frazier DOB: 01/18/1961 Phone: [phone] Reason for Call: See info below Regarding Appointment: Yes Appt Date: 11/15/2011 Appt Time: 4:15:00 PM Provider: Burchette, Bruce Reason: Details: Card is not good Outcome: Cancelled appointment in EPIC ([facility]) 

## 2011-11-17 LAB — URINE CULTURE: Colony Count: 2000

## 2011-11-18 NOTE — Progress Notes (Signed)
Quick Note:  Pt aware of results. ______ 

## 2012-01-10 ENCOUNTER — Ambulatory Visit (INDEPENDENT_AMBULATORY_CARE_PROVIDER_SITE_OTHER): Payer: BC Managed Care – PPO

## 2012-01-10 DIAGNOSIS — Z209 Contact with and (suspected) exposure to unspecified communicable disease: Secondary | ICD-10-CM

## 2012-01-12 LAB — TB SKIN TEST
Induration: 0
TB Skin Test: NEGATIVE mm

## 2012-01-20 ENCOUNTER — Ambulatory Visit (INDEPENDENT_AMBULATORY_CARE_PROVIDER_SITE_OTHER): Payer: BC Managed Care – PPO | Admitting: Family

## 2012-01-20 ENCOUNTER — Encounter: Payer: Self-pay | Admitting: Family

## 2012-01-20 DIAGNOSIS — R079 Chest pain, unspecified: Secondary | ICD-10-CM

## 2012-01-20 LAB — BASIC METABOLIC PANEL
BUN: 14 mg/dL (ref 6–23)
CO2: 27 mEq/L (ref 19–32)
Calcium: 9.4 mg/dL (ref 8.4–10.5)
Chloride: 105 mEq/L (ref 96–112)
Creatinine, Ser: 0.8 mg/dL (ref 0.4–1.2)
GFR: 89.69 mL/min (ref >60.00)
Glucose, Bld: 89 mg/dL (ref 70–99)
Potassium: 3.8 mEq/L (ref 3.5–5.1)
Sodium: 140 mEq/L (ref 135–145)

## 2012-01-20 LAB — TSH: TSH: 0.95 u[IU]/mL (ref 0.35–5.50)

## 2012-01-20 NOTE — Progress Notes (Signed)
  Subjective:    Patient ID: Monique Frazier, female    DOB: Jun 30, 1986, 26 y.o.   MRN: [id number]  Chest Pain  This is a new problem. The current episode started 1 to 4 weeks ago. The onset quality is sudden. The problem occurs intermittently. The problem has been unchanged. The pain is present in the substernal region. The pain is at a severity of 7/10. The pain is moderate. The quality of the pain is described as sharp. The pain does not radiate. Pertinent negatives include no abdominal pain, cough, dizziness, exertional chest pressure, irregular heartbeat, malaise/fatigue, nausea, palpitations, shortness of breath, syncope, vomiting or weakness. The pain is aggravated by nothing. She has tried nothing for the symptoms. The treatment provided moderate relief. Risk factors include stress (Applying for physical therapy ).      Review of Systems  Constitutional: Negative.  Negative for malaise/fatigue.  HENT: Negative.   Eyes: Negative.   Respiratory: Negative.  Negative for cough and shortness of breath.   Cardiovascular: Positive for chest pain. Negative for palpitations, leg swelling and syncope.  Gastrointestinal: Negative.  Negative for nausea, vomiting and abdominal pain.  Genitourinary: Negative.   Musculoskeletal: Negative.   Skin: Negative.   Neurological: Negative.  Negative for dizziness and weakness.  Hematological: Negative.   Psychiatric/Behavioral: Negative.        Objective:   Physical Exam  Constitutional: She is oriented to person, place, and time. She appears well-developed and well-nourished.  Neck: Normal range of motion. Neck supple.  Cardiovascular: Normal rate, regular rhythm and normal heart sounds.   Pulmonary/Chest: Effort normal and breath sounds normal.  Abdominal: Soft. Bowel sounds are normal.  Musculoskeletal: Normal range of motion.  Neurological: She is alert and oriented to person, place, and time. She has normal reflexes.  Skin: Skin is warm and  dry.          Assessment & Plan:  Assessment: Chest Pain, Anxiety  Plan:

## 2012-02-15 ENCOUNTER — Ambulatory Visit (INDEPENDENT_AMBULATORY_CARE_PROVIDER_SITE_OTHER): Payer: BC Managed Care – PPO

## 2012-02-15 DIAGNOSIS — Z23 Encounter for immunization: Secondary | ICD-10-CM

## 2012-06-05 IMAGING — CT CT ABD-PELV W/ CM
2 of 4 series · 17 of 46 positions shown, 19 images · IV contrast (water/omni  & 100ml omni 300)
Comparison: None.

CLINICAL DATA: Lower abdominal pain.  Leukocytosis.

CT ABDOMEN AND PELVIS WITH CONTRAST
TECHNIQUE: Multidetector CT imaging of the abdomen and pelvis was
performed following the standard protocol during bolus
administration of intravenous contrast.
Contrast: 90 ml Omnipaque-D44

[Series 2: routine abdomen · axial · 0.63mm/px · z∈[-489,-84]mm · 14 of 89 slices shown, 16 images]
[im 4/89  soft-tissue]
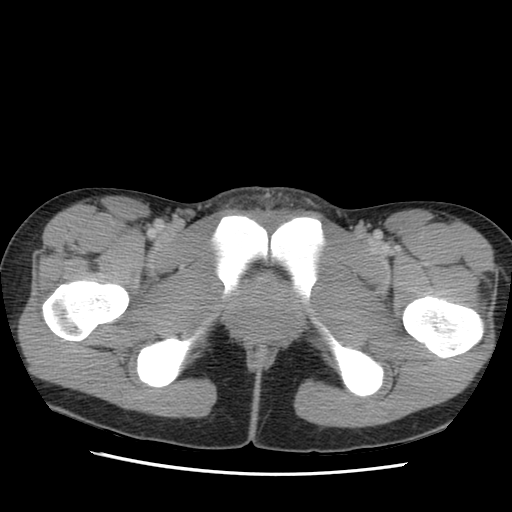
[im 4/89  bone]
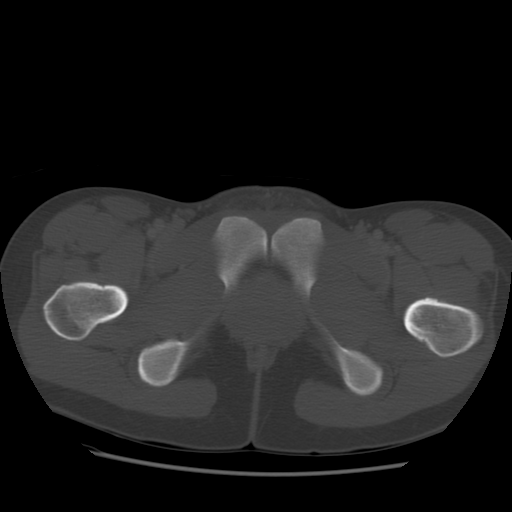
[im 12/89  soft-tissue]
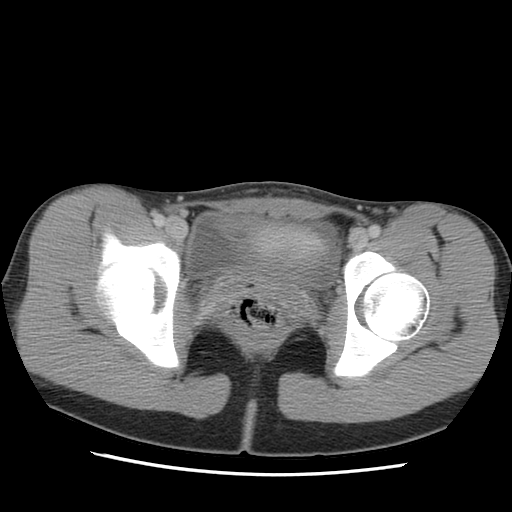
[im 19/89  soft-tissue]
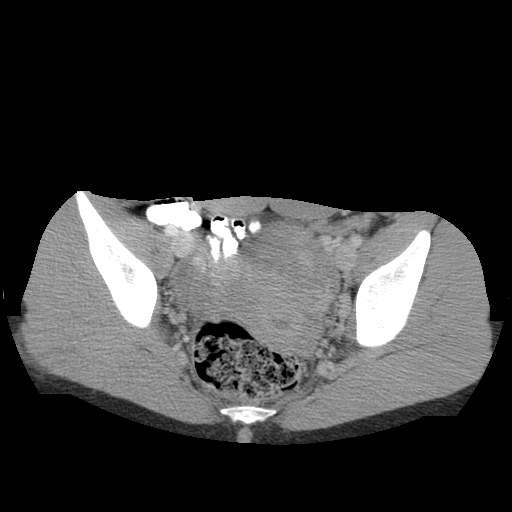
[im 23/89  soft-tissue]
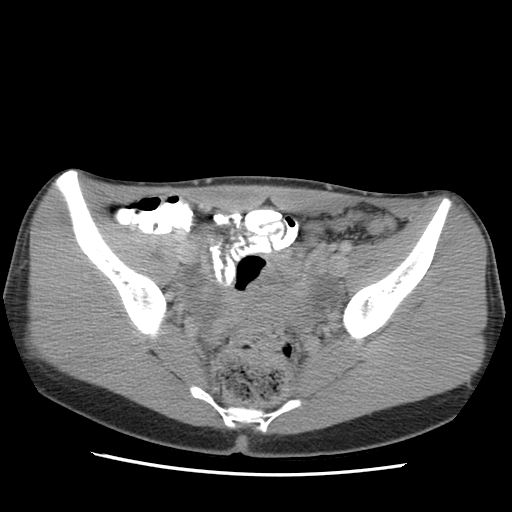
[im 30/89  soft-tissue]
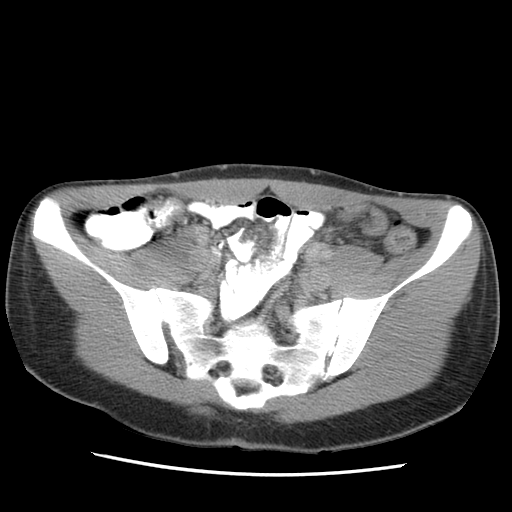
[im 37/89  soft-tissue]
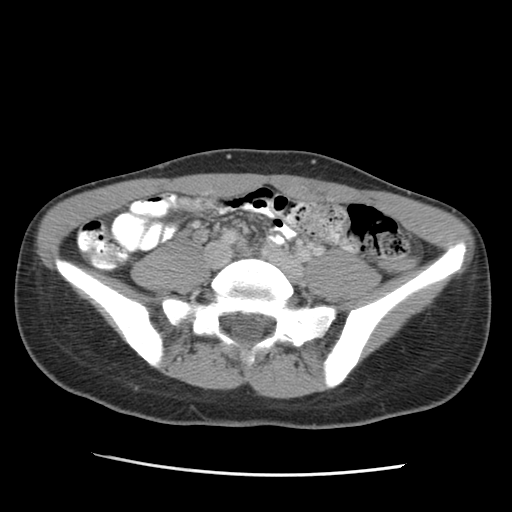
[im 41/89  soft-tissue]
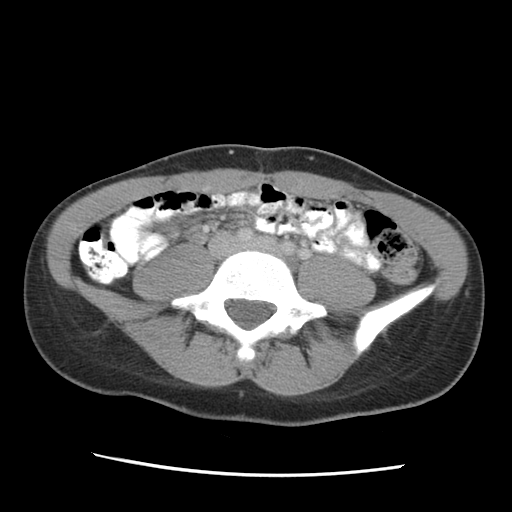
[im 48/89  soft-tissue]
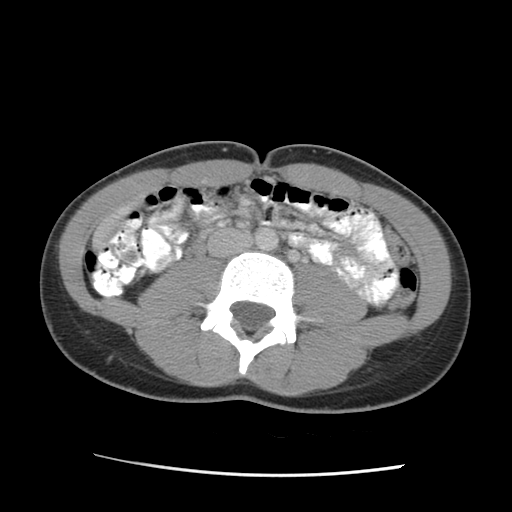
[im 52/89  soft-tissue]
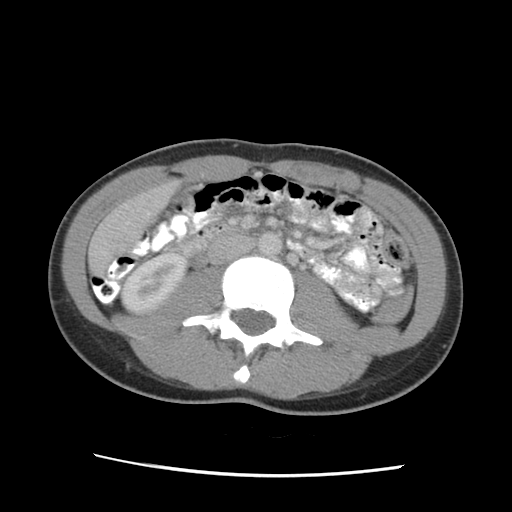
[im 52/89  bone]
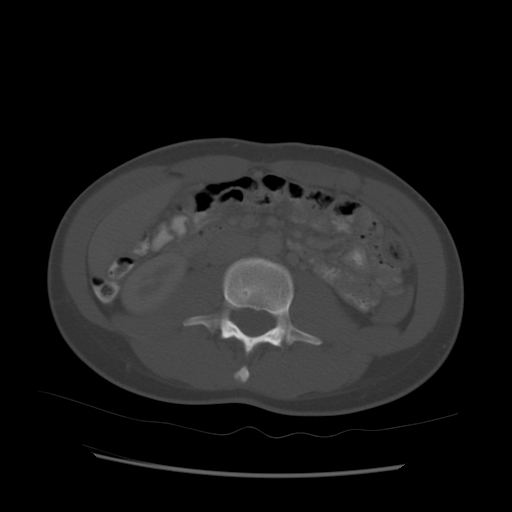
[im 59/89  soft-tissue]
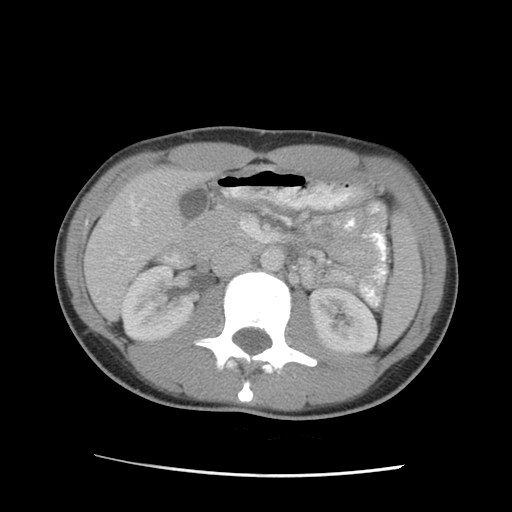
[im 67/89  soft-tissue]
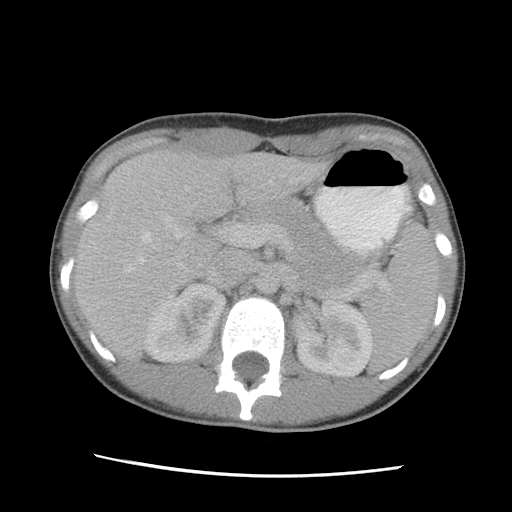
[im 70/89  soft-tissue]
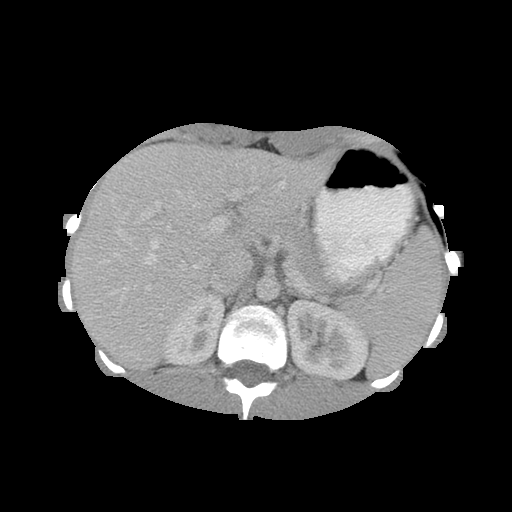
[im 78/89  soft-tissue]
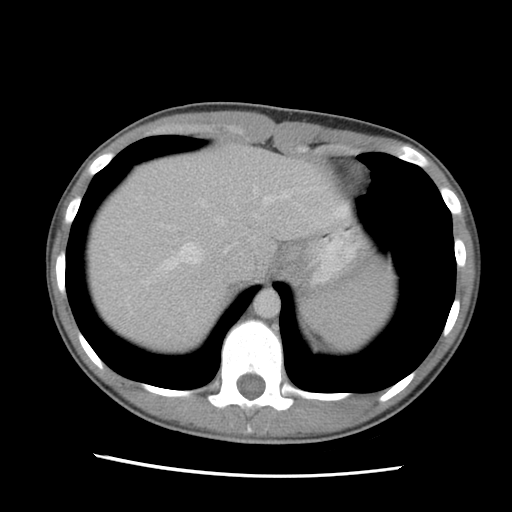
[im 85/89  soft-tissue]
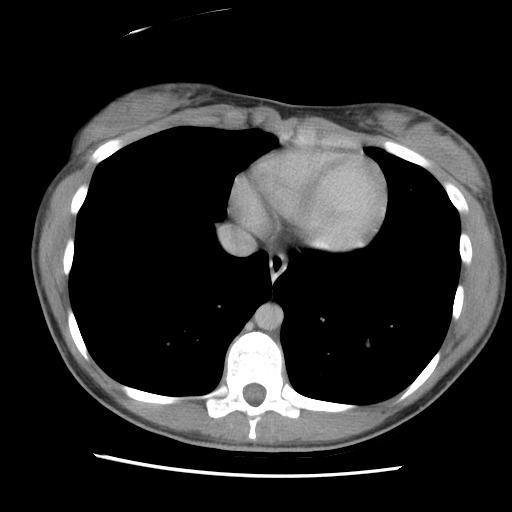

[Series 401: cor · coronal · 0.90mm/px · 3 of 81 slices shown]
[im 27/81  soft-tissue]
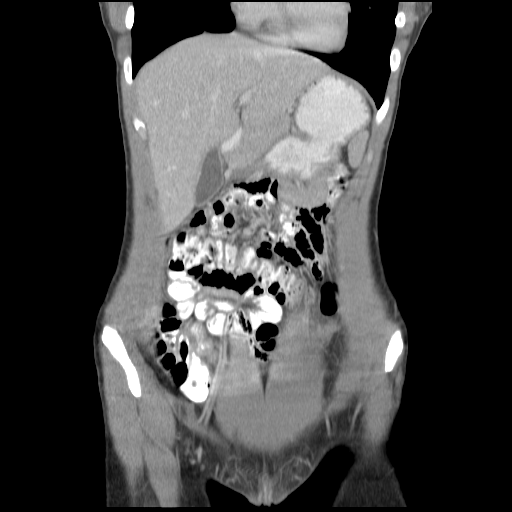
[im 36/81  soft-tissue]
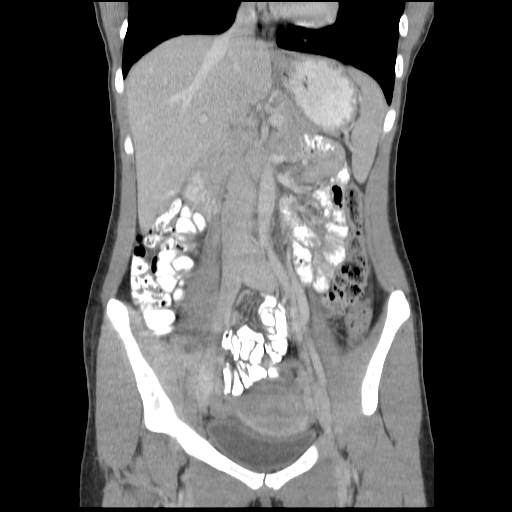
[im 45/81  soft-tissue]
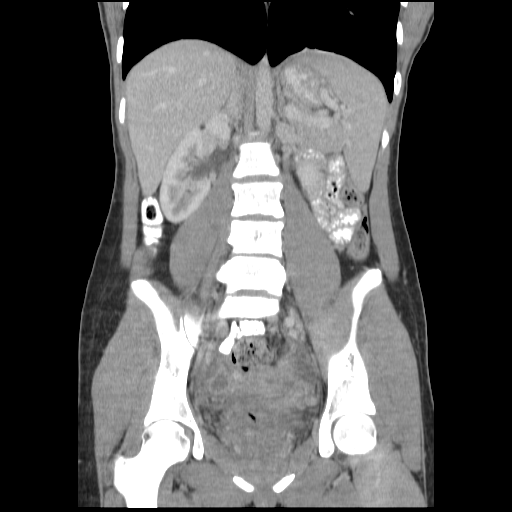

[17 of 46 positions shown; findings below may reference images not displayed]

FINDINGS: The liver, spleen, pancreas, and adrenal glands appear
unremarkable.

The gallbladder and biliary system appear unremarkable.

The kidneys appear unremarkable, as do the proximal ureters.

No pathologic retroperitoneal or porta hepatis adenopathy is
identified.

A structure likely representing the appendix is shown anterior to
the right psoas muscle on images 60 through 66 of series 2, and is
also shown on image 31 of series 401.  No  inflammation is
identified in this immediate vicinity.

There is an abnormal appearance of the right ovary, with a 2.2 x
1.7 cm fluid density structure centrally, and mild indistinctness
of the ovarian contour possibly reflecting some localized
inflammation.  No free pelvic fluid is identified in the uterus
appears unremarkable.  The left ovary appears unremarkable.

Urinary bladder appears normal.
IMPRESSION: 1.  No discrete appendiceal abnormality is identified.
2.  A cystic lesion in the right ovary measuring approximately 2 cm
in diameter.  If the patient's pain is pelvic in origin, pelvic
sonography may be helpful in further workup.
3.  Otherwise negative.

## 2012-08-08 ENCOUNTER — Encounter: Payer: Self-pay | Admitting: Internal Medicine

## 2012-08-08 ENCOUNTER — Ambulatory Visit (INDEPENDENT_AMBULATORY_CARE_PROVIDER_SITE_OTHER): Payer: BC Managed Care – PPO | Admitting: Internal Medicine

## 2012-08-08 VITALS — BP 118/78 | HR 68 | Temp 97.8°F | Wt 158.0 lb

## 2012-08-08 DIAGNOSIS — R350 Frequency of micturition: Secondary | ICD-10-CM

## 2012-08-08 DIAGNOSIS — R300 Dysuria: Secondary | ICD-10-CM

## 2012-08-08 DIAGNOSIS — N390 Urinary tract infection, site not specified: Secondary | ICD-10-CM

## 2012-08-08 LAB — POCT URINALYSIS DIPSTICK
Bilirubin, UA: NEGATIVE
Glucose, UA: NEGATIVE
Ketones, UA: NEGATIVE
Nitrite, UA: NEGATIVE
Protein, UA: NEGATIVE
Spec Grav, UA: 1.015
Urobilinogen, UA: 0.2
pH, UA: 6.5

## 2012-08-08 MED ORDER — SULFAMETHOXAZOLE-TRIMETHOPRIM 800-160 MG PO TABS: 1.0000 | ORAL_TABLET | Freq: Two times a day (BID) | ORAL | Status: AC

## 2012-08-08 NOTE — Progress Notes (Signed)
Chief Complaint  Patient presents with  . Dysuria    Has burning when she urinates.  . Urinary Frequency    HPI: Patient comes in today for SDA for  new problem evaluation. Onset one day of  dysuria frequency without associated fever or vaginal discharge. No flank pain but perhaps slightly sore in the right lower back no fever or chills.Last period ;on iud.    No recent antibiotic.s   he does have a history of UTIs in the past last time treated was over 6 months ago. No other change in her medical history. ROS: See pertinent positives and negatives per HPI.  Past Medical History  Diagnosis Date  . Febrile seizure     at age 42.5 yrs  . Motor skills developmental delay   . Speech delay   . Premature birth     by 4 weeks twin pregnancy  . Kyphosis     mild consult Dr. Noel Gerold in past  . Syncope     with neg ekg and eeg felt to be  . Migraines     to avoid estrogen based hormones  . UTI (urinary tract infection)     Family History  Problem Relation Age of Onset  . Single kidney Brother     History   Social History  . Marital Status: Single    Spouse Name: N/A    Number of Children: N/A  . Years of Education: N/A   Social History Main Topics  . Smoking status: Never Smoker   . Smokeless tobacco: None  . Alcohol Use: Yes     Comment: socially  . Drug Use: No  . Sexually Active: Yes    Birth Control/ Protection: IUD   Other Topics Concern  . None   Social History Narrative   [place] graduateLocal jobs was at [employer] now change  To [employer]  etoh exercise no tobacco    Outpatient Encounter Prescriptions as of 08/08/2012  Medication Sig Dispense Refill  . levonorgestrel (MIRENA) 20 MCG/24HR IUD 1 each by Intrauterine route once.        . sulfamethoxazole-trimethoprim (BACTRIM DS,SEPTRA DS) 800-160 MG per tablet Take 1 tablet by mouth 2 (two) times daily.  6 tablet  0    EXAM:  BP 118/78  Pulse 68  Temp 97.8 F (36.6 C) (Oral)  Wt 158 lb (71.668 kg)  There is no  height on file to calculate BMI.  GENERAL: vitals reviewed and listed above, alert, oriented, appears well hydrated and in no acute distress  HEENT: atraumatic, conjunctiva  clear, no obvious abnormalities on inspection of external nose and ears OP : no lesion edema or exudate   NECK: no obvious masses on inspection palpation  Abdomen:  Sof,t normal bowel sounds without hepatosplenomegaly, no guarding rebound or masses no CVA tenderness although points to right lb as area of mild discomfort  CV: HRRR, no clubbing cyanosis or  peripheral edema nl cap refill   MS: moves all extremities without noticeable focal  abnormality  PSYCH: pleasant and cooperative, no obvious depression or anxiety UA 3+ leuk and blood ASSESSMENT AND PLAN:  Discussed the following assessment and plan:  1. Dysuria  POC Urinalysis Dipstick   sx cw uti hx of same  empiric rx today fu if not better   2. Urinary frequency  POC Urinalysis Dipstick    -Patient advised to return or notify health care team  immediately if symptoms worsen or persist or new concerns arise.  Patient  Instructions  This acts like an uncomplicated UTI.  Take antibiotic and if not better in 3-5 days contact our office.    Neta Mends. [place] Jeff M.D.

## 2012-08-08 NOTE — Patient Instructions (Signed)
This acts like an uncomplicated UTI.  Take antibiotic and if not better in 3-5 days contact our office.

## 2012-09-27 ENCOUNTER — Other Ambulatory Visit (INDEPENDENT_AMBULATORY_CARE_PROVIDER_SITE_OTHER): Payer: BC Managed Care – PPO

## 2012-09-27 DIAGNOSIS — Z0000 Encounter for general adult medical examination without abnormal findings: Secondary | ICD-10-CM

## 2012-09-27 LAB — BASIC METABOLIC PANEL
BUN: 18 mg/dL (ref 6–23)
CO2: 29 mEq/L (ref 19–32)
Calcium: 9.9 mg/dL (ref 8.4–10.5)
Chloride: 105 mEq/L (ref 96–112)
Creatinine, Ser: 0.8 mg/dL (ref 0.4–1.2)
GFR: 93.05 mL/min (ref >60.00)
Glucose, Bld: 86 mg/dL (ref 70–99)
Potassium: 4.1 mEq/L (ref 3.5–5.1)
Sodium: 140 mEq/L (ref 135–145)

## 2012-09-27 LAB — HEPATIC FUNCTION PANEL
AST: 22 U/L (ref 0–37)
Albumin: 4.5 g/dL (ref 3.5–5.2)
Alkaline Phosphatase: 53 U/L (ref 39–117)
Bilirubin, Direct: 0.1 mg/dL (ref 0.0–0.3)
Total Bilirubin: 0.7 mg/dL (ref 0.3–1.2)
Total Protein: 7.7 g/dL (ref 6.0–8.3)

## 2012-09-27 LAB — CBC WITH DIFFERENTIAL/PLATELET
Basophils Absolute: 0.1 10*3/uL (ref 0.0–0.1)
Basophils Relative: 1.4 % (ref 0.0–3.0)
Eosinophils Absolute: 0.2 10*3/uL (ref 0.0–0.7)
Eosinophils Relative: 2.9 % (ref 0.0–5.0)
HCT: 42 % (ref 36.0–46.0)
Hemoglobin: 14.4 g/dL (ref 12.0–15.0)
Lymphocytes Relative: 29 % (ref 12.0–46.0)
Lymphs Abs: 1.8 10*3/uL (ref 0.7–4.0)
MCHC: 34.4 g/dL (ref 30.0–36.0)
MCV: 90 fl (ref 78.0–100.0)
Monocytes Absolute: 0.6 10*3/uL (ref 0.1–1.0)
Monocytes Relative: 9.8 % (ref 3.0–12.0)
Neutro Abs: 3.5 10*3/uL (ref 1.4–7.7)
Neutrophils Relative %: 56.9 % (ref 43.0–77.0)
Platelets: 176 10*3/uL (ref 150.0–400.0)
RBC: 4.67 Mil/uL (ref 3.87–5.11)
RDW: 13.4 % (ref 11.5–14.6)
WBC: 6.2 10*3/uL (ref 4.5–10.5)

## 2012-09-27 LAB — LIPID PANEL
LDL Cholesterol: 101 mg/dL — ABNORMAL HIGH (ref 0–99)
Total CHOL/HDL Ratio: 3
VLDL: 9.8 mg/dL (ref 0.0–40.0)

## 2012-10-04 ENCOUNTER — Ambulatory Visit (INDEPENDENT_AMBULATORY_CARE_PROVIDER_SITE_OTHER): Payer: BC Managed Care – PPO | Admitting: Internal Medicine

## 2012-10-04 ENCOUNTER — Encounter: Payer: Self-pay | Admitting: Internal Medicine

## 2012-10-04 ENCOUNTER — Other Ambulatory Visit (HOSPITAL_COMMUNITY)
Admission: RE | Admit: 2012-10-04 | Discharge: 2012-10-04 | Disposition: A | Discharge status: Home / Self Care | Payer: BC Managed Care – PPO | Source: Ambulatory Visit | Attending: Internal Medicine | Admitting: Internal Medicine

## 2012-10-04 VITALS — BP 100/66 | HR 103 | Temp 97.3°F | Ht 69.5 in | Wt 161.0 lb

## 2012-10-04 DIAGNOSIS — Z01419 Encounter for gynecological examination (general) (routine) without abnormal findings: Secondary | ICD-10-CM | POA: Insufficient documentation

## 2012-10-04 DIAGNOSIS — Z975 Presence of (intrauterine) contraceptive device: Secondary | ICD-10-CM

## 2012-10-04 DIAGNOSIS — Z113 Encounter for screening for infections with a predominantly sexual mode of transmission: Secondary | ICD-10-CM | POA: Insufficient documentation

## 2012-10-04 DIAGNOSIS — Z0000 Encounter for general adult medical examination without abnormal findings: Secondary | ICD-10-CM

## 2012-10-04 NOTE — Patient Instructions (Signed)
Your exam is normal  . Continue lifestyle intervention healthy eating and exercise . dont forget to exercise some and get enough sleep.  Will notify you  of pap when available.   Preventive Care for Adults, Female A healthy lifestyle and preventive care can promote health and wellness. Preventive health guidelines for women include the following key practices.  A routine yearly physical is a good way to check with your caregiver about your health and preventive screening. It is a chance to share any concerns and updates on your health, and to receive a thorough exam.  Visit your dentist for a routine exam and preventive care every 6 months. Brush your teeth twice a day and floss once a day. Good oral hygiene prevents tooth decay and gum disease.  The frequency of eye exams is based on your age, health, family medical history, use of contact lenses, and other factors. Follow your caregiver's recommendations for frequency of eye exams.  Eat a healthy diet. Foods like vegetables, fruits, whole grains, low-fat dairy products, and lean protein foods contain the nutrients you need without too many calories. Decrease your intake of foods high in solid fats, added sugars, and salt. Eat the right amount of calories for you.Get information about a proper diet from your caregiver, if necessary.  Regular physical exercise is one of the most important things you can do for your health. Most adults should get at least 150 minutes of moderate-intensity exercise (any activity that increases your heart rate and causes you to sweat) each week. In addition, most adults need muscle-strengthening exercises on 2 or more days a week.  Maintain a healthy weight. The body mass index (BMI) is a screening tool to identify possible weight problems. It provides an estimate of body fat based on height and weight. Your caregiver can help determine your BMI, and can help you achieve or maintain a healthy weight.For adults 20  years and older:  A BMI below 18.5 is considered underweight.  A BMI of 18.5 to 24.9 is normal.  A BMI of 25 to 29.9 is considered overweight.  A BMI of 30 and above is considered obese.  Maintain normal blood lipids and cholesterol levels by exercising and minimizing your intake of saturated fat. Eat a balanced diet with plenty of fruit and vegetables. Blood tests for lipids and cholesterol should begin at age 42 and be repeated every 5 years. If your lipid or cholesterol levels are high, you are over 50, or you are at high risk for heart disease, you may need your cholesterol levels checked more frequently.Ongoing high lipid and cholesterol levels should be treated with medicines if diet and exercise are not effective.  If you smoke, find out from your caregiver how to quit. If you do not use tobacco, do not start.  If you are pregnant, do not drink alcohol. If you are breastfeeding, be very cautious about drinking alcohol. If you are not pregnant and choose to drink alcohol, do not exceed 1 drink per day. One drink is considered to be 12 ounces (355 mL) of beer, 5 ounces (148 mL) of wine, or 1.5 ounces (44 mL) of liquor.  Avoid use of street drugs. Do not share needles with anyone. Ask for help if you need support or instructions about stopping the use of drugs.  High blood pressure causes heart disease and increases the risk of stroke. Your blood pressure should be checked at least every 1 to 2 years. Ongoing high blood pressure should  be treated with medicines if weight loss and exercise are not effective.  If you are 55 to 25 years old, ask your caregiver if you should take aspirin to prevent strokes.  Diabetes screening involves taking a blood sample to check your fasting blood sugar level. This should be done once every 3 years, after age 29, if you are within normal weight and without risk factors for diabetes. Testing should be considered at a younger age or be carried out more  frequently if you are overweight and have at least 1 risk factor for diabetes.  Breast cancer screening is essential preventive care for women. You should practice "breast self-awareness." This means understanding the normal appearance and feel of your breasts and may include breast self-examination. Any changes detected, no matter how small, should be reported to a caregiver. Women in their 48s and 30s should have a clinical breast exam (CBE) by a caregiver as part of a regular health exam every 1 to 3 years. After age 24, women should have a CBE every year. Starting at age 36, women should consider having a mammography (breast X-ray test) every year. Women who have a family history of breast cancer should talk to their caregiver about genetic screening. Women at a high risk of breast cancer should talk to their caregivers about having magnetic resonance imaging (MRI) and a mammography every year.  The Pap test is a screening test for cervical cancer. A Pap test can show cell changes on the cervix that might become cervical cancer if left untreated. A Pap test is a procedure in which cells are obtained and examined from the lower end of the uterus (cervix).  Women should have a Pap test starting at age 21.  Between ages 57 and 64, Pap tests should be repeated every 2 years.  Beginning at age 60, you should have a Pap test every 3 years as long as the past 3 Pap tests have been normal.  Some women have medical problems that increase the chance of getting cervical cancer. Talk to your caregiver about these problems. It is especially important to talk to your caregiver if a new problem develops soon after your last Pap test. In these cases, your caregiver may recommend more frequent screening and Pap tests.  The above recommendations are the same for women who have or have not gotten the vaccine for human papillomavirus (HPV).  If you had a hysterectomy for a problem that was not cancer or a condition  that could lead to cancer, then you no longer need Pap tests. Even if you no longer need a Pap test, a regular exam is a good idea to make sure no other problems are starting.  If you are between ages 51 and 56, and you have had normal Pap tests going back 10 years, you no longer need Pap tests. Even if you no longer need a Pap test, a regular exam is a good idea to make sure no other problems are starting.  If you have had past treatment for cervical cancer or a condition that could lead to cancer, you need Pap tests and screening for cancer for at least 20 years after your treatment.  If Pap tests have been discontinued, risk factors (such as a new sexual partner) need to be reassessed to determine if screening should be resumed.  The HPV test is an additional test that may be used for cervical cancer screening. The HPV test looks for the virus that can cause  the cell changes on the cervix. The cells collected during the Pap test can be tested for HPV. The HPV test could be used to screen women aged 73 years and older, and should be used in women of any age who have unclear Pap test results. After the age of 58, women should have HPV testing at the same frequency as a Pap test.  Colorectal cancer can be detected and often prevented. Most routine colorectal cancer screening begins at the age of 108 and continues through age 23. However, your caregiver may recommend screening at an earlier age if you have risk factors for colon cancer. On a yearly basis, your caregiver may provide home test kits to check for hidden blood in the stool. Use of a small camera at the end of a tube, to directly examine the colon (sigmoidoscopy or colonoscopy), can detect the earliest forms of colorectal cancer. Talk to your caregiver about this at age 95, when routine screening begins. Direct examination of the colon should be repeated every 5 to 10 years through age 71, unless early forms of pre-cancerous polyps or small  growths are found.  Hepatitis C blood testing is recommended for all people born from [birth year] through 1963 and any individual with known risks for hepatitis C.  Practice safe sex. Use condoms and avoid high-risk sexual practices to reduce the spread of sexually transmitted infections (STIs). STIs include gonorrhea, chlamydia, syphilis, trichomonas, herpes, HPV, and human immunodeficiency virus (HIV). Herpes, HIV, and HPV are viral illnesses that have no cure. They can result in disability, cancer, and death. Sexually active women aged 70 and younger should be checked for chlamydia. Older women with new or multiple partners should also be tested for chlamydia. Testing for other STIs is recommended if you are sexually active and at increased risk.  Osteoporosis is a disease in which the bones lose minerals and strength with aging. This can result in serious bone fractures. The risk of osteoporosis can be identified using a bone density scan. Women ages 48 and over and women at risk for fractures or osteoporosis should discuss screening with their caregivers. Ask your caregiver whether you should take a calcium supplement or vitamin D to reduce the rate of osteoporosis.  Menopause can be associated with physical symptoms and risks. Hormone replacement therapy is available to decrease symptoms and risks. You should talk to your caregiver about whether hormone replacement therapy is right for you.  Use sunscreen with sun protection factor (SPF) of 30 or more. Apply sunscreen liberally and repeatedly throughout the day. You should seek shade when your shadow is shorter than you. Protect yourself by wearing long sleeves, pants, a wide-brimmed hat, and sunglasses year round, whenever you are outdoors.  Once a month, do a whole body skin exam, using a mirror to look at the skin on your back. Notify your caregiver of new moles, moles that have irregular borders, moles that are larger than a pencil eraser, or moles  that have changed in shape or color.  Stay current with required immunizations.  Influenza. You need a dose every fall (or winter). The composition of the flu vaccine changes each year, so being vaccinated once is not enough.  Pneumococcal polysaccharide. You need 1 to 2 doses if you smoke cigarettes or if you have certain chronic medical conditions. You need 1 dose at age 44 (or older) if you have never been vaccinated.  Tetanus, diphtheria, pertussis (Tdap, Td). Get 1 dose of Tdap vaccine if you are  younger than age 39, are over 41 and have contact with an infant, are a Research scientist (physical sciences), are pregnant, or simply want to be protected from whooping cough. After that, you need a Td booster dose every 10 years. Consult your caregiver if you have not had at least 3 tetanus and diphtheria-containing shots sometime in your life or have a deep or dirty wound.  HPV. You need this vaccine if you are a woman age 30 or younger. The vaccine is given in 3 doses over 6 months.  Measles, mumps, rubella (MMR). You need at least 1 dose of MMR if you were born in 1957 or later. You may also need a second dose.  Meningococcal. If you are age 68 to 18 and a first-year college student living in a residence hall, or have one of several medical conditions, you need to get vaccinated against meningococcal disease. You may also need additional booster doses.  Zoster (shingles). If you are age 60 or older, you should get this vaccine.  Varicella (chickenpox). If you have never had chickenpox or you were vaccinated but received only 1 dose, talk to your caregiver to find out if you need this vaccine.  Hepatitis A. You need this vaccine if you have a specific risk factor for hepatitis A virus infection or you simply wish to be protected from this disease. The vaccine is usually given as 2 doses, 6 to 18 months apart.  Hepatitis B. You need this vaccine if you have a specific risk factor for hepatitis B virus infection or  you simply wish to be protected from this disease. The vaccine is given in 3 doses, usually over 6 months. Preventive Services / Frequency Ages 49 to 19  Blood pressure check.** / Every 1 to 2 years.  Lipid and cholesterol check.** / Every 5 years beginning at age 48.  Clinical breast exam.** / Every 3 years for women in their 71s and 30s.  Pap test.** / Every 2 years from ages 28 through 34. Every 3 years starting at age 1 through age 72 or 72 with a history of 3 consecutive normal Pap tests.  HPV screening.** / Every 3 years from ages 38 through ages 50 to 85 with a history of 3 consecutive normal Pap tests.  Hepatitis C blood test.** / For any individual with known risks for hepatitis C.  Skin self-exam. / Monthly.  Influenza immunization.** / Every year.  Pneumococcal polysaccharide immunization.** / 1 to 2 doses if you smoke cigarettes or if you have certain chronic medical conditions.  Tetanus, diphtheria, pertussis (Tdap, Td) immunization. / A one-time dose of Tdap vaccine. After that, you need a Td booster dose every 10 years.  HPV immunization. / 3 doses over 6 months, if you are 4 and younger.  Measles, mumps, rubella (MMR) immunization. / You need at least 1 dose of MMR if you were born in 1957 or later. You may also need a second dose.  Meningococcal immunization. / 1 dose if you are age 55 to 91 and a first-year college student living in a residence hall, or have one of several medical conditions, you need to get vaccinated against meningococcal disease. You may also need additional booster doses.  Varicella immunization.** / Consult your caregiver.  Hepatitis A immunization.** / Consult your caregiver. 2 doses, 6 to 18 months apart.  Hepatitis B immunization.** / Consult your caregiver. 3 doses usually over 6 months.   Fat and Cholesterol Control Diet Cholesterol levels in your body  are determined significantly by your diet. Cholesterol levels may also be related  to heart disease. The following material helps to explain this relationship and discusses what you can do to help keep your heart healthy. Not all cholesterol is bad. Low-density lipoprotein (LDL) cholesterol is the "bad" cholesterol. It may cause fatty deposits to build up inside your arteries. High-density lipoprotein (HDL) cholesterol is "good." It helps to remove the "bad" LDL cholesterol from your blood. Cholesterol is a very important risk factor for heart disease. Other risk factors are high blood pressure, smoking, stress, heredity, and weight. The heart muscle gets its supply of blood through the coronary arteries. If your LDL cholesterol is high and your HDL cholesterol is low, you are at risk for having fatty deposits build up in your coronary arteries. This leaves less room through which blood can flow. Without sufficient blood and oxygen, the heart muscle cannot function properly and you may feel chest pains (angina pectoris). When a coronary artery closes up entirely, a part of the heart muscle may die causing a heart attack (myocardial infarction). CHECKING CHOLESTEROL When your caregiver sends your blood to a lab to be examined for cholesterol, a complete lipid (fat) profile may be done. With this test, the total amount of cholesterol and levels of LDL and HDL are determined. Triglycerides are a type of fat that circulates in the blood. They can also be used to determine heart disease risk. The list below describes what the numbers should be: Test: Total Cholesterol.  Less than 200 mg/dl. Test: LDL "bad cholesterol."  Less than 100 mg/dl.  Less than 70 mg/dl if you are at very high risk of a heart attack or sudden cardiac death. Test: HDL "good cholesterol."  Greater than 50 mg/dl for women.  Greater than 40 mg/dl for men. Test: Triglycerides.  Less than 150 mg/dl. CONTROLLING CHOLESTEROL WITH DIET Although exercise and lifestyle factors are important, your diet is key. That is  because certain foods are known to raise cholesterol and others to lower it. The goal is to balance foods for their effect on cholesterol and more importantly, to replace saturated and trans fat with other types of fat, such as monounsaturated fat, polyunsaturated fat, and omega-3 fatty acids. On average, a person should consume no more than 15 to 17 g of saturated fat daily. Saturated and trans fats are considered "bad" fats, and they will raise LDL cholesterol. Saturated fats are primarily found in animal products such as meats, butter, and cream. However, that does not mean you need to give up all your favorite foods. Today, there are good tasting, low-fat, low-cholesterol substitutes for most of the things you like to eat. Choose low-fat or nonfat alternatives. Choose round or loin cuts of red meat. These types of cuts are lowest in fat and cholesterol. Chicken (without the skin), fish, veal, and ground [place] breast are great choices. Eliminate fatty meats, such as hot dogs and salami. Even shellfish have little or no saturated fat. Have a 3 oz (85 g) portion when you eat lean meat, poultry, or fish. Trans fats are also called "partially hydrogenated oils." They are oils that have been scientifically manipulated so that they are solid at room temperature resulting in a longer shelf life and improved taste and texture of foods in which they are added. Trans fats are found in stick margarine, some tub margarines, cookies, crackers, and baked goods.  When baking and cooking, oils are a great substitute for butter. The monounsaturated oils  are especially beneficial since it is believed they lower LDL and raise HDL. The oils you should avoid entirely are saturated tropical oils, such as coconut and palm.  Remember to eat a lot from food groups that are naturally free of saturated and trans fat, including fish, fruit, vegetables, beans, grains (barley, rice, couscous, bulgur wheat), and pasta (without cream  sauces).  IDENTIFYING FOODS THAT LOWER CHOLESTEROL  Soluble fiber may lower your cholesterol. This type of fiber is found in fruits such as apples, vegetables such as broccoli, potatoes, and carrots, legumes such as beans, peas, and lentils, and grains such as barley. Foods fortified with plant sterols (phytosterol) may also lower cholesterol. You should eat at least 2 g per day of these foods for a cholesterol lowering effect.  Read package labels to identify low-saturated fats, trans fat free, and low-fat foods at the supermarket. Select cheeses that have only 2 to 3 g saturated fat per ounce. Use a heart-healthy tub margarine that is free of trans fats or partially hydrogenated oil. When buying baked goods (cookies, crackers), avoid partially hydrogenated oils. Breads and muffins should be made from whole grains (whole-wheat or whole oat flour, instead of "flour" or "enriched flour"). Buy non-creamy canned soups with reduced salt and no added fats.  FOOD PREPARATION TECHNIQUES  Never deep-fry. If you must fry, either stir-fry, which uses very little fat, or use non-stick cooking sprays. When possible, broil, bake, or roast meats, and steam vegetables. Instead of putting butter or margarine on vegetables, use lemon and herbs, applesauce, and cinnamon (for squash and sweet potatoes), nonfat yogurt, salsa, and low-fat dressings for salads.  LOW-SATURATED FAT / LOW-FAT FOOD SUBSTITUTES Meats / Saturated Fat (g)  Avoid: Steak, marbled (3 oz/85 g) / 11 g  Choose: Steak, lean (3 oz/85 g) / 4 g  Avoid: Hamburger (3 oz/85 g) / 7 g  Choose: Hamburger, lean (3 oz/85 g) / 5 g  Avoid: Ham (3 oz/85 g) / 6 g  Choose: Ham, lean cut (3 oz/85 g) / 2.4 g  Avoid: Chicken, with skin, dark meat (3 oz/85 g) / 4 g  Choose: Chicken, skin removed, dark meat (3 oz/85 g) / 2 g  Avoid: Chicken, with skin, light meat (3 oz/85 g) / 2.5 g  Choose: Chicken, skin removed, light meat (3 oz/85 g) / 1 g Dairy / Saturated  Fat (g)  Avoid: Whole milk (1 cup) / 5 g  Choose: Low-fat milk, 2% (1 cup) / 3 g  Choose: Low-fat milk, 1% (1 cup) / 1.5 g  Choose: Skim milk (1 cup) / 0.3 g  Avoid: Hard cheese (1 oz/28 g) / 6 g  Choose: Skim milk cheese (1 oz/28 g) / 2 to 3 g  Avoid: Cottage cheese, 4% fat (1 cup) / 6.5 g  Choose: Low-fat cottage cheese, 1% fat (1 cup) / 1.5 g  Avoid: Ice cream (1 cup) / 9 g  Choose: Sherbet (1 cup) / 2.5 g  Choose: Nonfat frozen yogurt (1 cup) / 0.3 g  Choose: Frozen fruit bar / trace  Avoid: Whipped cream (1 tbs) / 3.5 g  Choose: Nondairy whipped topping (1 tbs) / 1 g Condiments / Saturated Fat (g)  Avoid: Mayonnaise (1 tbs) / 2 g  Choose: Low-fat mayonnaise (1 tbs) / 1 g  Avoid: Butter (1 tbs) / 7 g  Choose: Extra light margarine (1 tbs) / 1 g  Avoid: Coconut oil (1 tbs) / 11.8 g  Choose: Olive oil (1  tbs) / 1.8 g  Choose: Corn oil (1 tbs) / 1.7 g  Choose: Safflower oil (1 tbs) / 1.2 g  Choose: Sunflower oil (1 tbs) / 1.4 g  Choose: Soybean oil (1 tbs) / 2.4 g  Choose: Canola oil (1 tbs) / 1 g Document Released: 08/23/2005 Document Revised: 11/15/2011 Document Reviewed: 02/11/2011 [facility] Patient Information 2013 [place], [place].

## 2012-10-04 NOTE — Progress Notes (Signed)
Chief Complaint  Patient presents with  . Annual Exam    HPI: Patient comes in today for Preventive Health Care visit  No major change in health status since last visit . uti is better   Periods spottiy  On iud no abd pain of significance .  caffiene 1-2 per day . etoh 1-2 per week.at most  No tobacco  No time currently for much exercise   ROS:  GEN/ HEENT: No fever, significant weight changes sweats headaches vision problems hearing changes, CV/ PULM; No chest pain shortness of breath cough, syncope,edema  change in exercise tolerance. GI /GU: No adominal pain, vomiting, change in bowel habits. No blood in the stool. No significant GU symptoms. SKIN/HEME: ,no acute skin rashes suspicious lesions or bleeding. No lymphadenopathy, nodules, masses.  NEURO/ PSYCH:  No neurologic signs such as weakness numbness. No depression anxiety. IMM/ Allergy: No unusual infections.  Allergy .   REST of 12 system review negative except as per HPI   Past Medical History  Diagnosis Date  . Febrile seizure     at age 27 yrs  . Motor skills developmental delay   . Speech delay   . Premature birth     by 4 weeks twin pregnancy  . Kyphosis     mild consult Dr. Noel Gerold in past  . Syncope     with neg ekg and eeg felt to be  . Migraines     to avoid estrogen based hormones  . UTI (urinary tract infection)     Family History  Problem Relation Age of Onset  . Single kidney Brother   . Hyperlipidemia Father     History   Social History  . Marital Status: Single    Spouse Name: N/A    Number of Children: N/A  . Years of Education: N/A   Social History Main Topics  . Smoking status: Never Smoker   . Smokeless tobacco: None  . Alcohol Use: Yes     Comment: socially  . Drug Use: No  . Sexually Active: Yes    Birth Control/ Protection: IUD   Other Topics Concern  . None   Social History Narrative   [place] graduateLocal jobs was at [employer] now change  To [employer]  etoh exercise no  tobaccoSchool and work sitting fro GREs    Outpatient Encounter Prescriptions as of 10/04/2012  Medication Sig Dispense Refill  . levonorgestrel (MIRENA) 20 MCG/24HR IUD 1 each by Intrauterine route once.          EXAM:  BP 100/66  Pulse 103  Temp 97.3 F (36.3 C)  Ht 5' 9.5" (1.765 m)  Wt 161 lb (73.029 kg)  BMI 23.43 kg/m2  SpO2 96%  Body mass index is 23.43 kg/(m^2).  Physical Exam: Vital signs reviewed WUJ:WJXB is a well-developed well-nourished alert cooperative   female who appears her stated age in no acute distress.  HEENT: normocephalic atraumatic , Eyes: PERRL EOM's full, conjunctiva clear, Nares: paten,t no deformity discharge or tenderness., Ears: no deformity EAC's clear TMs with normal landmarks. Mouth: clear OP, no lesions, edema.  Moist mucous membranes. Dentition in adequate repair. NECK: supple without masses, thyromegaly or bruits. CHEST/PULM:  Clear to auscultation and percussion breath sounds equal no wheeze , rales or rhonchi. No chest wall deformities or tenderness. Breast: normal by inspection . No dimpling, discharge, masses, tenderness or discharge . CV: PMI is nondisplaced, S1 S2 no gallops, murmurs, rubs. Peripheral pulses are full without delay.No JVD .  ABDOMEN: Bowel sounds normal nontender  No guard or rebound, no hepato splenomegal no CVA tenderness.  No hernia. Extremtities:  No clubbing cyanosis or edema, no acute joint swelling or redness no focal atrophy NEURO:  Oriented x3, cranial nerves 3-12 appear to be intact, no obvious focal weakness,gait within normal limits no abnormal reflexes or asymmetrical SKIN: No acute rashes normal turgor, color, no bruising or petechiae. PSYCH: Oriented, good eye contact, no obvious depression anxiety, cognition and judgment appear normal. LN: no cervical axillary inguinal adenopathy Pelvic: NL ext GU, labia clear without lesions or rash . Vagina no lesions .Cervix: clear  Mild  amount of dark blood on vault   Couldn't see string cause of this UTERUS: Neg CMT Adnexa:  clear no masses . PAP done [facility] chl screen    Lab Results  Component Value Date   WBC 6.2 09/27/2012   HGB 14.4 09/27/2012   HCT 42.0 09/27/2012   PLT 176.0 09/27/2012   GLUCOSE 86 09/27/2012   CHOL 183 09/27/2012   TRIG 49.0 09/27/2012   HDL 71.80 09/27/2012   LDLCALC 101* 09/27/2012   ALT 21 09/27/2012   AST 22 09/27/2012   NA 140 09/27/2012   K 4.1 09/27/2012   CL 105 09/27/2012   CREATININE 0.8 09/27/2012   BUN 18 09/27/2012   CO2 29 09/27/2012   TSH 0.96 09/27/2012    ASSESSMENT AND PLAN:  Discussed the following assessment and plan:  1. Encounter for preventive health examination  PAP [Ione]  2. Routine gynecological examination  PAP [Ojus]  3. IUD (intrauterine device) in place    disc lipids and lsi . utd on hcm   Patient Care Team: Madelin Headings, MD as PCP - General Patient Instructions  Your exam is normal  . Continue lifestyle intervention healthy eating and exercise . dont forget to exercise some and get enough sleep.  Will notify you  of pap when available.   Preventive Care for Adults, Female A healthy lifestyle and preventive care can promote health and wellness. Preventive health guidelines for women include the following key practices.  A routine yearly physical is a good way to check with your caregiver about your health and preventive screening. It is a chance to share any concerns and updates on your health, and to receive a thorough exam.  Visit your dentist for a routine exam and preventive care every 6 months. Brush your teeth twice a day and floss once a day. Good oral hygiene prevents tooth decay and gum disease.  The frequency of eye exams is based on your age, health, family medical history, use of contact lenses, and other factors. Follow your caregiver's recommendations for frequency of eye exams.  Eat a healthy diet. Foods like vegetables, fruits, whole grains, low-fat dairy products,  and lean protein foods contain the nutrients you need without too many calories. Decrease your intake of foods high in solid fats, added sugars, and salt. Eat the right amount of calories for you.Get information about a proper diet from your caregiver, if necessary.  Regular physical exercise is one of the most important things you can do for your health. Most adults should get at least 150 minutes of moderate-intensity exercise (any activity that increases your heart rate and causes you to sweat) each week. In addition, most adults need muscle-strengthening exercises on 2 or more days a week.  Maintain a healthy weight. The body mass index (BMI) is a screening tool to identify possible weight problems.  It provides an estimate of body fat based on height and weight. Your caregiver can help determine your BMI, and can help you achieve or maintain a healthy weight.For adults 20 years and older:  A BMI below 18.5 is considered underweight.  A BMI of 18.5 to 24.9 is normal.  A BMI of 25 to 29.9 is considered overweight.  A BMI of 30 and above is considered obese.  Maintain normal blood lipids and cholesterol levels by exercising and minimizing your intake of saturated fat. Eat a balanced diet with plenty of fruit and vegetables. Blood tests for lipids and cholesterol should begin at age 48 and be repeated every 5 years. If your lipid or cholesterol levels are high, you are over 50, or you are at high risk for heart disease, you may need your cholesterol levels checked more frequently.Ongoing high lipid and cholesterol levels should be treated with medicines if diet and exercise are not effective.  If you smoke, find out from your caregiver how to quit. If you do not use tobacco, do not start.  If you are pregnant, do not drink alcohol. If you are breastfeeding, be very cautious about drinking alcohol. If you are not pregnant and choose to drink alcohol, do not exceed 1 drink per day. One drink is  considered to be 12 ounces (355 mL) of beer, 5 ounces (148 mL) of wine, or 1.5 ounces (44 mL) of liquor.  Avoid use of street drugs. Do not share needles with anyone. Ask for help if you need support or instructions about stopping the use of drugs.  High blood pressure causes heart disease and increases the risk of stroke. Your blood pressure should be checked at least every 1 to 2 years. Ongoing high blood pressure should be treated with medicines if weight loss and exercise are not effective.  If you are 35 to 25 years old, ask your caregiver if you should take aspirin to prevent strokes.  Diabetes screening involves taking a blood sample to check your fasting blood sugar level. This should be done once every 3 years, after age 71, if you are within normal weight and without risk factors for diabetes. Testing should be considered at a younger age or be carried out more frequently if you are overweight and have at least 1 risk factor for diabetes.  Breast cancer screening is essential preventive care for women. You should practice "breast self-awareness." This means understanding the normal appearance and feel of your breasts and may include breast self-examination. Any changes detected, no matter how small, should be reported to a caregiver. Women in their 26s and 30s should have a clinical breast exam (CBE) by a caregiver as part of a regular health exam every 1 to 3 years. After age 87, women should have a CBE every year. Starting at age 39, women should consider having a mammography (breast X-ray test) every year. Women who have a family history of breast cancer should talk to their caregiver about genetic screening. Women at a high risk of breast cancer should talk to their caregivers about having magnetic resonance imaging (MRI) and a mammography every year.  The Pap test is a screening test for cervical cancer. A Pap test can show cell changes on the cervix that might become cervical cancer if  left untreated. A Pap test is a procedure in which cells are obtained and examined from the lower end of the uterus (cervix).  Women should have a Pap test starting at age 47.  Between ages 23 and 37, Pap tests should be repeated every 2 years.  Beginning at age 24, you should have a Pap test every 3 years as long as the past 3 Pap tests have been normal.  Some women have medical problems that increase the chance of getting cervical cancer. Talk to your caregiver about these problems. It is especially important to talk to your caregiver if a new problem develops soon after your last Pap test. In these cases, your caregiver may recommend more frequent screening and Pap tests.  The above recommendations are the same for women who have or have not gotten the vaccine for human papillomavirus (HPV).  If you had a hysterectomy for a problem that was not cancer or a condition that could lead to cancer, then you no longer need Pap tests. Even if you no longer need a Pap test, a regular exam is a good idea to make sure no other problems are starting.  If you are between ages 66 and 15, and you have had normal Pap tests going back 10 years, you no longer need Pap tests. Even if you no longer need a Pap test, a regular exam is a good idea to make sure no other problems are starting.  If you have had past treatment for cervical cancer or a condition that could lead to cancer, you need Pap tests and screening for cancer for at least 20 years after your treatment.  If Pap tests have been discontinued, risk factors (such as a new sexual partner) need to be reassessed to determine if screening should be resumed.  The HPV test is an additional test that may be used for cervical cancer screening. The HPV test looks for the virus that can cause the cell changes on the cervix. The cells collected during the Pap test can be tested for HPV. The HPV test could be used to screen women aged 51 years and older, and should  be used in women of any age who have unclear Pap test results. After the age of 45, women should have HPV testing at the same frequency as a Pap test.  Colorectal cancer can be detected and often prevented. Most routine colorectal cancer screening begins at the age of 19 and continues through age 6. However, your caregiver may recommend screening at an earlier age if you have risk factors for colon cancer. On a yearly basis, your caregiver may provide home test kits to check for hidden blood in the stool. Use of a small camera at the end of a tube, to directly examine the colon (sigmoidoscopy or colonoscopy), can detect the earliest forms of colorectal cancer. Talk to your caregiver about this at age 14, when routine screening begins. Direct examination of the colon should be repeated every 5 to 10 years through age 21, unless early forms of pre-cancerous polyps or small growths are found.  Hepatitis C blood testing is recommended for all people born from [birth year] through 1963 and any individual with known risks for hepatitis C.  Practice safe sex. Use condoms and avoid high-risk sexual practices to reduce the spread of sexually transmitted infections (STIs). STIs include gonorrhea, chlamydia, syphilis, trichomonas, herpes, HPV, and human immunodeficiency virus (HIV). Herpes, HIV, and HPV are viral illnesses that have no cure. They can result in disability, cancer, and death. Sexually active women aged 5 and younger should be checked for chlamydia. Older women with new or multiple partners should also be tested for chlamydia. Testing for other  STIs is recommended if you are sexually active and at increased risk.  Osteoporosis is a disease in which the bones lose minerals and strength with aging. This can result in serious bone fractures. The risk of osteoporosis can be identified using a bone density scan. Women ages 36 and over and women at risk for fractures or osteoporosis should discuss screening with  their caregivers. Ask your caregiver whether you should take a calcium supplement or vitamin D to reduce the rate of osteoporosis.  Menopause can be associated with physical symptoms and risks. Hormone replacement therapy is available to decrease symptoms and risks. You should talk to your caregiver about whether hormone replacement therapy is right for you.  Use sunscreen with sun protection factor (SPF) of 30 or more. Apply sunscreen liberally and repeatedly throughout the day. You should seek shade when your shadow is shorter than you. Protect yourself by wearing long sleeves, pants, a wide-brimmed hat, and sunglasses year round, whenever you are outdoors.  Once a month, do a whole body skin exam, using a mirror to look at the skin on your back. Notify your caregiver of new moles, moles that have irregular borders, moles that are larger than a pencil eraser, or moles that have changed in shape or color.  Stay current with required immunizations.  Influenza. You need a dose every fall (or winter). The composition of the flu vaccine changes each year, so being vaccinated once is not enough.  Pneumococcal polysaccharide. You need 1 to 2 doses if you smoke cigarettes or if you have certain chronic medical conditions. You need 1 dose at age 28 (or older) if you have never been vaccinated.  Tetanus, diphtheria, pertussis (Tdap, Td). Get 1 dose of Tdap vaccine if you are younger than age 47, are over 54 and have contact with an infant, are a Research scientist (physical sciences), are pregnant, or simply want to be protected from whooping cough. After that, you need a Td booster dose every 10 years. Consult your caregiver if you have not had at least 3 tetanus and diphtheria-containing shots sometime in your life or have a deep or dirty wound.  HPV. You need this vaccine if you are a woman age 21 or younger. The vaccine is given in 3 doses over 6 months.  Measles, mumps, rubella (MMR). You need at least 1 dose of MMR if  you were born in 1957 or later. You may also need a second dose.  Meningococcal. If you are age 64 to 43 and a first-year college student living in a residence hall, or have one of several medical conditions, you need to get vaccinated against meningococcal disease. You may also need additional booster doses.  Zoster (shingles). If you are age 67 or older, you should get this vaccine.  Varicella (chickenpox). If you have never had chickenpox or you were vaccinated but received only 1 dose, talk to your caregiver to find out if you need this vaccine.  Hepatitis A. You need this vaccine if you have a specific risk factor for hepatitis A virus infection or you simply wish to be protected from this disease. The vaccine is usually given as 2 doses, 6 to 18 months apart.  Hepatitis B. You need this vaccine if you have a specific risk factor for hepatitis B virus infection or you simply wish to be protected from this disease. The vaccine is given in 3 doses, usually over 6 months. Preventive Services / Frequency Ages 61 to 61  Blood pressure check.** /  Every 1 to 2 years.  Lipid and cholesterol check.** / Every 5 years beginning at age 49.  Clinical breast exam.** / Every 3 years for women in their 68s and 30s.  Pap test.** / Every 2 years from ages 100 through 62. Every 3 years starting at age 57 through age 25 or 85 with a history of 3 consecutive normal Pap tests.  HPV screening.** / Every 3 years from ages 10 through ages 75 to 62 with a history of 3 consecutive normal Pap tests.  Hepatitis C blood test.** / For any individual with known risks for hepatitis C.  Skin self-exam. / Monthly.  Influenza immunization.** / Every year.  Pneumococcal polysaccharide immunization.** / 1 to 2 doses if you smoke cigarettes or if you have certain chronic medical conditions.  Tetanus, diphtheria, pertussis (Tdap, Td) immunization. / A one-time dose of Tdap vaccine. After that, you need a Td booster dose  every 10 years.  HPV immunization. / 3 doses over 6 months, if you are 30 and younger.  Measles, mumps, rubella (MMR) immunization. / You need at least 1 dose of MMR if you were born in 1957 or later. You may also need a second dose.  Meningococcal immunization. / 1 dose if you are age 73 to 76 and a first-year college student living in a residence hall, or have one of several medical conditions, you need to get vaccinated against meningococcal disease. You may also need additional booster doses.  Varicella immunization.** / Consult your caregiver.  Hepatitis A immunization.** / Consult your caregiver. 2 doses, 6 to 18 months apart.  Hepatitis B immunization.** / Consult your caregiver. 3 doses usually over 6 months.   Fat and Cholesterol Control Diet Cholesterol levels in your body are determined significantly by your diet. Cholesterol levels may also be related to heart disease. The following material helps to explain this relationship and discusses what you can do to help keep your heart healthy. Not all cholesterol is bad. Low-density lipoprotein (LDL) cholesterol is the "bad" cholesterol. It may cause fatty deposits to build up inside your arteries. High-density lipoprotein (HDL) cholesterol is "good." It helps to remove the "bad" LDL cholesterol from your blood. Cholesterol is a very important risk factor for heart disease. Other risk factors are high blood pressure, smoking, stress, heredity, and weight. The heart muscle gets its supply of blood through the coronary arteries. If your LDL cholesterol is high and your HDL cholesterol is low, you are at risk for having fatty deposits build up in your coronary arteries. This leaves less room through which blood can flow. Without sufficient blood and oxygen, the heart muscle cannot function properly and you may feel chest pains (angina pectoris). When a coronary artery closes up entirely, a part of the heart muscle may die causing a heart attack  (myocardial infarction). CHECKING CHOLESTEROL When your caregiver sends your blood to a lab to be examined for cholesterol, a complete lipid (fat) profile may be done. With this test, the total amount of cholesterol and levels of LDL and HDL are determined. Triglycerides are a type of fat that circulates in the blood. They can also be used to determine heart disease risk. The list below describes what the numbers should be: Test: Total Cholesterol.  Less than 200 mg/dl. Test: LDL "bad cholesterol."  Less than 100 mg/dl.  Less than 70 mg/dl if you are at very high risk of a heart attack or sudden cardiac death. Test: HDL "good cholesterol."  Greater  than 50 mg/dl for women.  Greater than 40 mg/dl for men. Test: Triglycerides.  Less than 150 mg/dl. CONTROLLING CHOLESTEROL WITH DIET Although exercise and lifestyle factors are important, your diet is key. That is because certain foods are known to raise cholesterol and others to lower it. The goal is to balance foods for their effect on cholesterol and more importantly, to replace saturated and trans fat with other types of fat, such as monounsaturated fat, polyunsaturated fat, and omega-3 fatty acids. On average, a person should consume no more than 15 to 17 g of saturated fat daily. Saturated and trans fats are considered "bad" fats, and they will raise LDL cholesterol. Saturated fats are primarily found in animal products such as meats, butter, and cream. However, that does not mean you need to give up all your favorite foods. Today, there are good tasting, low-fat, low-cholesterol substitutes for most of the things you like to eat. Choose low-fat or nonfat alternatives. Choose round or loin cuts of red meat. These types of cuts are lowest in fat and cholesterol. Chicken (without the skin), fish, veal, and ground [place] breast are great choices. Eliminate fatty meats, such as hot dogs and salami. Even shellfish have little or no saturated fat.  Have a 3 oz (85 g) portion when you eat lean meat, poultry, or fish. Trans fats are also called "partially hydrogenated oils." They are oils that have been scientifically manipulated so that they are solid at room temperature resulting in a longer shelf life and improved taste and texture of foods in which they are added. Trans fats are found in stick margarine, some tub margarines, cookies, crackers, and baked goods.  When baking and cooking, oils are a great substitute for butter. The monounsaturated oils are especially beneficial since it is believed they lower LDL and raise HDL. The oils you should avoid entirely are saturated tropical oils, such as coconut and palm.  Remember to eat a lot from food groups that are naturally free of saturated and trans fat, including fish, fruit, vegetables, beans, grains (barley, rice, couscous, bulgur wheat), and pasta (without cream sauces).  IDENTIFYING FOODS THAT LOWER CHOLESTEROL  Soluble fiber may lower your cholesterol. This type of fiber is found in fruits such as apples, vegetables such as broccoli, potatoes, and carrots, legumes such as beans, peas, and lentils, and grains such as barley. Foods fortified with plant sterols (phytosterol) may also lower cholesterol. You should eat at least 2 g per day of these foods for a cholesterol lowering effect.  Read package labels to identify low-saturated fats, trans fat free, and low-fat foods at the supermarket. Select cheeses that have only 2 to 3 g saturated fat per ounce. Use a heart-healthy tub margarine that is free of trans fats or partially hydrogenated oil. When buying baked goods (cookies, crackers), avoid partially hydrogenated oils. Breads and muffins should be made from whole grains (whole-wheat or whole oat flour, instead of "flour" or "enriched flour"). Buy non-creamy canned soups with reduced salt and no added fats.  FOOD PREPARATION TECHNIQUES  Never deep-fry. If you must fry, either stir-fry, which  uses very little fat, or use non-stick cooking sprays. When possible, broil, bake, or roast meats, and steam vegetables. Instead of putting butter or margarine on vegetables, use lemon and herbs, applesauce, and cinnamon (for squash and sweet potatoes), nonfat yogurt, salsa, and low-fat dressings for salads.  LOW-SATURATED FAT / LOW-FAT FOOD SUBSTITUTES Meats / Saturated Fat (g)  Avoid: Steak, marbled (3 oz/85 g) /  11 g  Choose: Steak, lean (3 oz/85 g) / 4 g  Avoid: Hamburger (3 oz/85 g) / 7 g  Choose: Hamburger, lean (3 oz/85 g) / 5 g  Avoid: Ham (3 oz/85 g) / 6 g  Choose: Ham, lean cut (3 oz/85 g) / 2.4 g  Avoid: Chicken, with skin, dark meat (3 oz/85 g) / 4 g  Choose: Chicken, skin removed, dark meat (3 oz/85 g) / 2 g  Avoid: Chicken, with skin, light meat (3 oz/85 g) / 2.5 g  Choose: Chicken, skin removed, light meat (3 oz/85 g) / 1 g Dairy / Saturated Fat (g)  Avoid: Whole milk (1 cup) / 5 g  Choose: Low-fat milk, 2% (1 cup) / 3 g  Choose: Low-fat milk, 1% (1 cup) / 1.5 g  Choose: Skim milk (1 cup) / 0.3 g  Avoid: Hard cheese (1 oz/28 g) / 6 g  Choose: Skim milk cheese (1 oz/28 g) / 2 to 3 g  Avoid: Cottage cheese, 4% fat (1 cup) / 6.5 g  Choose: Low-fat cottage cheese, 1% fat (1 cup) / 1.5 g  Avoid: Ice cream (1 cup) / 9 g  Choose: Sherbet (1 cup) / 2.5 g  Choose: Nonfat frozen yogurt (1 cup) / 0.3 g  Choose: Frozen fruit bar / trace  Avoid: Whipped cream (1 tbs) / 3.5 g  Choose: Nondairy whipped topping (1 tbs) / 1 g Condiments / Saturated Fat (g)  Avoid: Mayonnaise (1 tbs) / 2 g  Choose: Low-fat mayonnaise (1 tbs) / 1 g  Avoid: Butter (1 tbs) / 7 g  Choose: Extra light margarine (1 tbs) / 1 g  Avoid: Coconut oil (1 tbs) / 11.8 g  Choose: Olive oil (1 tbs) / 1.8 g  Choose: Corn oil (1 tbs) / 1.7 g  Choose: Safflower oil (1 tbs) / 1.2 g  Choose: Sunflower oil (1 tbs) / 1.4 g  Choose: Soybean oil (1 tbs) / 2.4 g  Choose: Canola oil (1  tbs) / 1 g Document Released: 08/23/2005 Document Revised: 11/15/2011 Document Reviewed: 02/11/2011 [facility] Patient Information 2013 [place], [place].     Neta Mends. Panosh M.D. Health Maintenance  Topic Date Due  . Pap Smear  12/11/2005  . Influenza Vaccine  05/07/2013  . Tetanus/tdap  02/14/2022   Health Maintenance Review }

## 2012-10-10 NOTE — Progress Notes (Signed)
Quick Note:  Tell patient PAP is normal. STI. screen normal ______

## 2012-10-11 ENCOUNTER — Encounter: Payer: Self-pay | Admitting: Family Medicine

## 2012-12-18 ENCOUNTER — Ambulatory Visit: Payer: BC Managed Care – PPO | Admitting: Family Medicine

## 2013-01-01 ENCOUNTER — Telehealth: Payer: Self-pay | Admitting: Family Medicine

## 2013-01-01 NOTE — Telephone Encounter (Signed)
This patient dropped off some paperwork on 12/26/12.  She will need to be scheduled for a PPD before I can finish this paperwork.  Please call the patient and scheduled her on Mon, Tues, Wed or Friday for this.  Thanks!!!

## 2013-01-01 NOTE — Telephone Encounter (Signed)
Left message for patient to call back to schedule.  °

## 2013-01-02 ENCOUNTER — Ambulatory Visit (INDEPENDENT_AMBULATORY_CARE_PROVIDER_SITE_OTHER): Payer: BC Managed Care – PPO | Admitting: Family Medicine

## 2013-01-02 DIAGNOSIS — Z111 Encounter for screening for respiratory tuberculosis: Secondary | ICD-10-CM

## 2013-01-05 LAB — TB SKIN TEST: TB Skin Test: NEGATIVE

## 2013-01-12 ENCOUNTER — Ambulatory Visit (INDEPENDENT_AMBULATORY_CARE_PROVIDER_SITE_OTHER): Payer: BC Managed Care – PPO | Admitting: Family Medicine

## 2013-01-12 DIAGNOSIS — Z111 Encounter for screening for respiratory tuberculosis: Secondary | ICD-10-CM

## 2013-01-22 ENCOUNTER — Ambulatory Visit (INDEPENDENT_AMBULATORY_CARE_PROVIDER_SITE_OTHER): Payer: BC Managed Care – PPO | Admitting: Family Medicine

## 2013-01-22 DIAGNOSIS — Z0184 Encounter for antibody response examination: Secondary | ICD-10-CM

## 2013-01-22 DIAGNOSIS — Z8619 Personal history of other infectious and parasitic diseases: Secondary | ICD-10-CM

## 2013-01-26 ENCOUNTER — Telehealth: Payer: Self-pay | Admitting: Internal Medicine

## 2013-01-26 NOTE — Progress Notes (Signed)
Quick Note:  Called and left a detailed message at designed cell phone number. ______

## 2013-01-26 NOTE — Telephone Encounter (Signed)
Patient Information:  Caller Name: Monique Frazier  Phone: [phone]  Patient: Monique, Frazier  Gender: Female  DOB: 1985/12/23  Age: 26 Years  PCP: Berniece Andreas [facility])  Pregnant: No  Office Follow Up:  Does the office need to follow up with this patient?: No  Instructions For The Office: N/A   Symptoms  Reason For Call & Symptoms: Patient calling about nausea, diarrhea, low back pain and headache.  Relates diarrhea has subsided, with 3 stools in past 24 hours.  .  Headache rated at 3 of 10, Back pain rated at 2 of 10.  Reports she passed something that appeared black, was thin like paper and quantity estimated at half a sheet of paper towel on 01/24/13 pm  That stool also reportedly had stringy red tissue like substance.    Stool has returned to usual color since then.   Other emergent symptoms ruled out.  Reviewed Health History In EMR: Yes  Reviewed Medications In EMR: Yes  Reviewed Allergies In EMR: Yes  Reviewed Surgeries / Procedures: Yes  Date of Onset of Symptoms: 01/24/2013  Treatments Tried: Tums - some relief  Treatments Tried Worked: No OB / GYN:  LMP: Unknown  Guideline(s) Used:  Diarrhea  Disposition Per Guideline:   Go to ED Now (or to Office with PCP Approval)  Reason For Disposition Reached:   Black bowel movements  Advice Given:  Reassurance:  In healthy adults, new-onset diarrhea is usually caused by a viral infection of the intestines, which you can treat at home. Diarrhea is the body's way of getting rid of the infection. Here are some tips on how to keep ahead of the fluid losses.  Fluids:  Drink more fluids, at least 8-10 glasses (8 oz or 240 ml) daily.  For example: sports drinks, diluted fruit juices, soft drinks.  Supplement this with saltine crackers or soups to make certain that you are getting sufficient fluid and salt to meet your body's needs.  Avoid caffeinated beverages (Reason: caffeine is mildly dehydrating).  Nutrition:  Maintaining some food intake during episodes of diarrhea is important.  Ideal initial foods include boiled starches/cereals (e.g., potatoes, rice, noodles, wheat, oats) with a small amount of salt to taste.  Call Back If:  Signs of dehydration occur (e.g., no urine for more than 12 hours, very dry mouth, lightheaded, etc.)  You become worse.  Patient Refused Recommendation:  Patient Will Go To [facility]  States she does not want to go to ED.

## 2013-07-12 ENCOUNTER — Other Ambulatory Visit: Payer: Self-pay

## 2013-08-10 ENCOUNTER — Telehealth: Payer: Self-pay | Admitting: Internal Medicine

## 2013-08-10 NOTE — Telephone Encounter (Signed)
Noted.  Will pass along to [facility].

## 2013-08-10 NOTE — Telephone Encounter (Signed)
Patient Information:  Caller Name: [name]  Phone: [phone]  Patient: Monique Frazier  Gender: Female  DOB: Jan 19, 1986  Age: 27 Years  PCP: Berniece Andreas [facility])  Pregnant: No  Office Follow Up:  Does the office need to follow up with this patient?: No  Instructions For The Office: N/A  RN Note:  Home care advice and call back parameters reviewed. Encourage if she believes this is IUD issues to contact her OB/GYN [facility] for guidance.  Understanding expressed. Declined appt at this time  Symptoms  Reason For Call & Symptoms: Patient states she is  having lower abdominal pain /supra pubic area.  Describes as "sharp pain" . Onset yesterday 07/10/13.  Intermittent. No vaginal discharge. No urinary discomfort. Patient has IUD for 2 years.  Reviewed Health History In EMR: Yes  Reviewed Medications In EMR: Yes  Reviewed Allergies In EMR: Yes  Reviewed Surgeries / Procedures: Yes  Date of Onset of Symptoms: 08/09/2013  Treatments Tried: Aleve  Treatments Tried Worked: No OB / GYN:  LMP: Unknown  Guideline(s) Used:  Abdominal Pain - Female  Disposition Per Guideline:   Home Care  Reason For Disposition Reached:   Mild abdominal pain  Advice Given:  Rest:  Lie down and rest until you feel better.  Fluids:  Sip clear fluids only (e.g., water, flat soft drinks or 1/2 strength fruit juice) until the pain has been gone for over 2 hours. Then slowly return to a regular diet.  Reassurance:  A mild stomachache can be caused by indigestion, gas pains or overeating. Sometimes a stomachache signals the onset of a vomiting illness due to a viral gastroenteritis ("stomach flu").  Here is some care advice that should help.  Diet:  Slowly advance diet from clear liquids to a bland diet  Avoid alcohol or caffeinated beverages  Avoid greasy or fatty foods.  Avoid NSAIDS and Aspirin  : Avoid any drug that can irritate the stomach lining and make the pain worse (especially  aspirin and NSAIDs like ibuprofen).  Call Back If:  Abdominal pain is constant and present for more than 2 hours  Abdominal pains come and go and are present for more than 24 hours  You become worse.  Patient Will Follow Care Advice:  YES

## 2013-08-11 NOTE — Telephone Encounter (Signed)
Information noted .  Fu with [place] or gyne i f   persistent or progressive  Symptoms.

## 2013-10-10 ENCOUNTER — Other Ambulatory Visit (HOSPITAL_COMMUNITY)
Admission: RE | Admit: 2013-10-10 | Discharge: 2013-10-10 | Disposition: A | Discharge status: Home / Self Care | Payer: 59 | Source: Ambulatory Visit | Attending: Internal Medicine | Admitting: Internal Medicine

## 2013-10-10 ENCOUNTER — Encounter: Payer: Self-pay | Admitting: Internal Medicine

## 2013-10-10 ENCOUNTER — Ambulatory Visit (INDEPENDENT_AMBULATORY_CARE_PROVIDER_SITE_OTHER): Payer: 59 | Admitting: Internal Medicine

## 2013-10-10 VITALS — BP 126/88 | Temp 98.9°F | Ht 69.75 in | Wt 168.0 lb

## 2013-10-10 DIAGNOSIS — Z0000 Encounter for general adult medical examination without abnormal findings: Secondary | ICD-10-CM

## 2013-10-10 DIAGNOSIS — Z975 Presence of (intrauterine) contraceptive device: Secondary | ICD-10-CM

## 2013-10-10 DIAGNOSIS — Z01419 Encounter for gynecological examination (general) (routine) without abnormal findings: Secondary | ICD-10-CM | POA: Insufficient documentation

## 2013-10-10 DIAGNOSIS — Z113 Encounter for screening for infections with a predominantly sexual mode of transmission: Secondary | ICD-10-CM | POA: Insufficient documentation

## 2013-10-10 DIAGNOSIS — R109 Unspecified abdominal pain: Secondary | ICD-10-CM

## 2013-10-10 DIAGNOSIS — Z23 Encounter for immunization: Secondary | ICD-10-CM

## 2013-10-10 NOTE — Progress Notes (Signed)
Chief Complaint  Patient presents with  . Annual Exam    HPI: Patient comes in today for Preventive Health Care visit   No major change in health status since last visit . IUD  Since 8 12 mirena  Had evaluation for abd [facility]  Told had a number or ovarian cysts and saw gyne but felt the pain could be from IC insread  To see dr Rosana Hoes soon Has cut out etoh caffeine carbonation etc .   ROS:  GEN/ HEENT: No fever, significant weight changes sweats headaches vision problems hearing changes, CV/ PULM; No chest pain shortness of breath cough, syncope,edema  change in exercise tolerance. GI /GU: No , vomiting, change in bowel habits. No blood in the stool. SKIN/HEME: ,no acute skin rashes suspicious lesions or bleeding. No lymphadenopathy, nodules, masses.  NEURO/ PSYCH:  No neurologic signs such as weakness numbness. No depression some anxiety with stopping school part time work  IMM/ Allergy: No unusual infections.  Allergy .   REST of 12 system review negative except as per HPI   Past Medical History  Diagnosis Date  . Febrile seizure     at age 49.5 yrs  . Motor skills developmental delay   . Speech delay   . Premature birth     by 4 weeks twin pregnancy  . Kyphosis     mild consult Dr. Patrice Paradise in past  . Syncope     with neg ekg and eeg felt to be  . Migraines     to avoid estrogen based hormones  . UTI (urinary tract infection)     Family History  Problem Relation Age of Onset  . Single kidney Brother   . Hyperlipidemia Father     History   Social History  . Marital Status: Single    Spouse Name: N/A    Number of Children: N/A  . Years of Education: N/A   Social History Main Topics  . Smoking status: Never Smoker   . Smokeless tobacco: None  . Alcohol Use: Yes     Comment: socially  . Drug Use: No  . Sexual Activity: Yes    Birth Control/ Protection: IUD   Other Topics Concern  . 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 With BF  No pets.    Working [employer].     Current neg tad                      Outpatient Encounter Prescriptions as of 10/10/2013  Medication Sig  . levonorgestrel (MIRENA) 20 MCG/24HR IUD 1 each by Intrauterine route once.      EXAM:  BP 126/88  Temp(Src) 98.9 F (37.2 C) (Oral)  Ht 5' 9.75" (1.772 m)  Wt 168 lb (76.204 kg)  BMI 24.27 kg/m2  Body mass index is 24.27 kg/(m^2).  Physical Exam: Vital signs reviewed [id number] is a well-developed well-nourished alert cooperative   female who appears her stated age in no acute distress.  HEENT: normocephalic atraumatic , Eyes: PERRL EOM's full, conjunctiva clear, Nares: paten,t no deformity discharge or tenderness., Ears: no deformity EAC's clear TMs with normal landmarks. Mouth: clear OP, no lesions, edema.  Moist mucous membranes. Dentition  in adequate repair. NECK: supple without masses, thyromegaly or bruits. CHEST/PULM:  Clear to auscultation and percussion breath sounds equal no wheeze , rales or rhonchi. No chest wall deformities or tenderness. Mild kyphosis  Breast: normal by inspection . No dimpling, discharge, masses, tenderness or discharge . CV: PMI is nondisplaced, S1 S2 no gallops, murmurs, rubs. Peripheral pulses are full without delay.No JVD .  ABDOMEN: Bowel sounds normal nontender  No guard or rebound, no hepato splenomegal no CVA tenderness.  No hernia. Extremtities:  No clubbing cyanosis or edema, no acute joint swelling or redness no focal atrophy NEURO:  Oriented x3, cranial nerves 3-12 appear to be intact, no obvious focal weakness,gait within normal limits no abnormal reflexes or asymmetrical SKIN: No acute rashes normal turgor, color, no bruising or petechiae. PSYCH: Oriented, good eye contact, no obvious depression anxiety, cognition and judgment appear  normal. LN: no cervical axillary inguinal adenopathy Pelvic: NL ext GU, labia clear without lesions or rash . Vagina no lesions .Cervix: clear  UTERUS: Neg CMT Adnexa:  clear no masses . PAP done    ASSESSMENT AND PLAN:  Discussed the following assessment and plan:  Encounter for routine gynecological examination - Plan: PAP [[place]]  Encounter for preventive health examination - Plan: PAP [[place]]  Need for prophylactic vaccination and inoculation against influenza - Plan: Flu Vaccine QUAD 36+ mos PF IM (Fluarix)  IUD (intrauterine device) in place  Abdominal pain, unspecified site - ov cyst under eval for poss IC  Patient Care Team: Burnis Medin, MD as PCP - General Patient Instructions  150 minutes of exercise weeks  ,  maintain weight  To healthy levels. Avoid trans fats and processed foods;  Increase fresh fruits and veges to 5 servings per day. And avoid sweet beverages  Including tea and juice.  Will notify you  of  Pap  when available. Have  You specialist send [place] a copy of evaluation when done.

## 2013-10-10 NOTE — Patient Instructions (Signed)
150 minutes of exercise weeks  ,  maintain weight  To healthy levels. Avoid trans fats and processed foods;  Increase fresh fruits and veges to 5 servings per day. And avoid sweet beverages  Including tea and juice.  Will notify you  of  Pap  when available. Have  You specialist send [place] a copy of evaluation when done.   Preventive Care for Adults, Female A healthy lifestyle and preventive care can promote health and wellness. Preventive health guidelines for women include the following key practices.  A routine yearly physical is a good way to check with your health care provider about your health and preventive screening. It is a chance to share any concerns and updates on your health and to receive a thorough exam.  Visit your dentist for a routine exam and preventive care every 6 months. Brush your teeth twice a day and floss once a day. Good oral hygiene prevents tooth decay and gum disease.  The frequency of eye exams is based on your age, health, family medical history, use of contact lenses, and other factors. Follow your health care provider's recommendations for frequency of eye exams.  Eat a healthy diet. Foods like vegetables, fruits, whole grains, low-fat dairy products, and lean protein foods contain the nutrients you need without too many calories. Decrease your intake of foods high in solid fats, added sugars, and salt. Eat the right amount of calories for you.Get information about a proper diet from your health care provider, if necessary.  Regular physical exercise is one of the most important things you can do for your health. Most adults should get at least 150 minutes of moderate-intensity exercise (any activity that increases your heart rate and causes you to sweat) each week. In addition, most adults need muscle-strengthening exercises on 2 or more days a week.  Maintain a healthy weight. The body mass index (BMI) is a screening tool to identify possible weight problems. It  provides an estimate of body fat based on height and weight. Your health care provider can find your BMI, and can help you achieve or maintain a healthy weight.For adults 20 years and older:  A BMI below 18.5 is considered underweight.  A BMI of 18.5 to 24.9 is normal.  A BMI of 25 to 29.9 is considered overweight.  A BMI of 30 and above is considered obese.  Maintain normal blood lipids and cholesterol levels by exercising and minimizing your intake of saturated fat. Eat a balanced diet with plenty of fruit and vegetables. Blood tests for lipids and cholesterol should begin at age 46 and be repeated every 5 years. If your lipid or cholesterol levels are high, you are over 50, or you are at high risk for heart disease, you may need your cholesterol levels checked more frequently.Ongoing high lipid and cholesterol levels should be treated with medicines if diet and exercise are not working.  If you smoke, find out from your health care provider how to quit. If you do not use tobacco, do not start.  Lung cancer screening is recommended for adults aged 64 80 years who are at high risk for developing lung cancer because of a history of smoking. A yearly low-dose CT scan of the lungs is recommended for people who have at least a 30-pack-year history of smoking and are a current smoker or have quit within the past 15 years. A pack year of smoking is smoking an average of 1 pack of cigarettes a day for 1  year (for example: 1 pack a day for 30 years or 2 packs a day for 15 years). Yearly screening should continue until the smoker has stopped smoking for at least 15 years. Yearly screening should be stopped for people who develop a health problem that would prevent them from having lung cancer treatment.  If you are pregnant, do not drink alcohol. If you are breastfeeding, be very cautious about drinking alcohol. If you are not pregnant and choose to drink alcohol, do not have more than 1 drink per day. One  drink is considered to be 12 ounces (355 mL) of beer, 5 ounces (148 mL) of wine, or 1.5 ounces (44 mL) of liquor.  Avoid use of street drugs. Do not share needles with anyone. Ask for help if you need support or instructions about stopping the use of drugs.  High blood pressure causes heart disease and increases the risk of stroke. Your blood pressure should be checked at least every 1 to 2 years. Ongoing high blood pressure should be treated with medicines if weight loss and exercise do not work.  If you are 31 26 years old, ask your health care provider if you should take aspirin to prevent strokes.  Diabetes screening involves taking a blood sample to check your fasting blood sugar level. This should be done once every 3 years, after age 88, if you are within normal weight and without risk factors for diabetes. Testing should be considered at a younger age or be carried out more frequently if you are overweight and have at least 1 risk factor for diabetes.  Breast cancer screening is essential preventive care for women. You should practice "breast self-awareness." This means understanding the normal appearance and feel of your breasts and may include breast self-examination. Any changes detected, no matter how small, should be reported to a health care provider. Women in their 42s and 30s should have a clinical breast exam (CBE) by a health care provider as part of a regular health exam every 1 to 3 years. After age 74, women should have a CBE every year. Starting at age 5, women should consider having a mammogram (breast X-ray test) every year. Women who have a family history of breast cancer should talk to their health care provider about genetic screening. Women at a high risk of breast cancer should talk to their health care providers about having an MRI and a mammogram every year.  Breast cancer gene (BRCA)-related cancer risk assessment is recommended for women who have family members with  BRCA-related cancers. BRCA-related cancers include breast, ovarian, tubal, and peritoneal cancers. Having family members with these cancers may be associated with an increased risk for harmful changes (mutations) in the breast cancer genes BRCA1 and BRCA2. Results of the assessment will determine the need for genetic counseling and BRCA1 and BRCA2 testing.  The Pap test is a screening test for cervical cancer. A Pap test can show cell changes on the cervix that might become cervical cancer if left untreated. A Pap test is a procedure in which cells are obtained and examined from the lower end of the uterus (cervix).  Women should have a Pap test starting at age 42.  Between ages 46 and 49, Pap tests should be repeated every 2 years.  Beginning at age 37, you should have a Pap test every 3 years as long as the past 3 Pap tests have been normal.  Some women have medical problems that increase the chance of getting  cervical cancer. Talk to your health care provider about these problems. It is especially important to talk to your health care provider if a new problem develops soon after your last Pap test. In these cases, your health care provider may recommend more frequent screening and Pap tests.  The above recommendations are the same for women who have or have not gotten the vaccine for human papillomavirus (HPV).  If you had a hysterectomy for a problem that was not cancer or a condition that could lead to cancer, then you no longer need Pap tests. Even if you no longer need a Pap test, a regular exam is a good idea to make sure no other problems are starting.  If you are between ages 71 and 36 years, and you have had normal Pap tests going back 10 years, you no longer need Pap tests. Even if you no longer need a Pap test, a regular exam is a good idea to make sure no other problems are starting.  If you have had past treatment for cervical cancer or a condition that could lead to cancer, you  need Pap tests and screening for cancer for at least 20 years after your treatment.  If Pap tests have been discontinued, risk factors (such as a new sexual partner) need to be reassessed to determine if screening should be resumed.  The HPV test is an additional test that may be used for cervical cancer screening. The HPV test looks for the virus that can cause the cell changes on the cervix. The cells collected during the Pap test can be tested for HPV. The HPV test could be used to screen women aged 28 years and older, and should be used in women of any age who have unclear Pap test results. After the age of 48, women should have HPV testing at the same frequency as a Pap test.  Colorectal cancer can be detected and often prevented. Most routine colorectal cancer screening begins at the age of 66 years and continues through age 32 years. However, your health care provider may recommend screening at an earlier age if you have risk factors for colon cancer. On a yearly basis, your health care provider may provide home test kits to check for hidden blood in the stool. Use of a small camera at the end of a tube, to directly examine the colon (sigmoidoscopy or colonoscopy), can detect the earliest forms of colorectal cancer. Talk to your health care provider about this at age 71, when routine screening begins. Direct exam of the colon should be repeated every 5 10 years through age 10 years, unless early forms of pre-cancerous polyps or small growths are found.  People who are at an increased risk for hepatitis B should be screened for this virus. You are considered at high risk for hepatitis B if:  You were born in a country where hepatitis B occurs often. Talk with your health care provider about which countries are considered high risk.  Your parents were born in a high-risk country and you have not received a shot to protect against hepatitis B (hepatitis B vaccine).  You have HIV or AIDS.  You  use needles to inject street drugs.  You live with, or have sex with, someone who has Hepatitis B.  You get hemodialysis treatment.  You take certain medicines for conditions like cancer, organ transplantation, and autoimmune conditions.  Hepatitis C blood testing is recommended for all people born from [birth year] through 1965 and  any individual with known risks for hepatitis C.  Practice safe sex. Use condoms and avoid high-risk sexual practices to reduce the spread of sexually transmitted infections (STIs). STIs include gonorrhea, chlamydia, syphilis, trichomonas, herpes, HPV, and human immunodeficiency virus (HIV). Herpes, HIV, and HPV are viral illnesses that have no cure. They can result in disability, cancer, and death. Sexually active women aged 22 years and younger should be checked for chlamydia. Older women with new or multiple partners should also be tested for chlamydia. Testing for other STIs is recommended if you are sexually active and at increased risk.  Osteoporosis is a disease in which the bones lose minerals and strength with aging. This can result in serious bone fractures or breaks. The risk of osteoporosis can be identified using a bone density scan. Women ages 52 years and over and women at risk for fractures or osteoporosis should discuss screening with their health care providers. Ask your health care provider whether you should take a calcium supplement or vitamin D to reduce the rate of osteoporosis.  Menopause can be associated with physical symptoms and risks. Hormone replacement therapy is available to decrease symptoms and risks. You should talk to your health care provider about whether hormone replacement therapy is right for you.  Use sunscreen. Apply sunscreen liberally and repeatedly throughout the day. You should seek shade when your shadow is shorter than you. Protect yourself by wearing long sleeves, pants, a wide-brimmed hat, and sunglasses year round, whenever you  are outdoors.  Once a month, do a whole body skin exam, using a mirror to look at the skin on your back. Tell your health care provider of new moles, moles that have irregular borders, moles that are larger than a pencil eraser, or moles that have changed in shape or color.  Stay current with required vaccines (immunizations).  Influenza vaccine. All adults should be immunized every year.  Tetanus, diphtheria, and acellular pertussis (Td, Tdap) vaccine. Pregnant women should receive 1 dose of Tdap vaccine during each pregnancy. The dose should be obtained regardless of the length of time since the last dose. Immunization is preferred during the 27th 36th week of gestation. An adult who has not previously received Tdap or who does not know her vaccine status should receive 1 dose of Tdap. This initial dose should be followed by tetanus and diphtheria toxoids (Td) booster doses every 10 years. Adults with an unknown or incomplete history of completing a 3-dose immunization series with Td-containing vaccines should begin or complete a primary immunization series including a Tdap dose. Adults should receive a Td booster every 10 years.  Varicella vaccine. An adult without evidence of immunity to varicella should receive 2 doses or a second dose if she has previously received 1 dose. Pregnant females who do not have evidence of immunity should receive the first dose after pregnancy. This first dose should be obtained before leaving the health care facility. The second dose should be obtained 4 8 weeks after the first dose.  Human papillomavirus (HPV) vaccine. Females aged 71 26 years who have not received the vaccine previously should obtain the 3-dose series. The vaccine is not recommended for use in pregnant females. However, pregnancy testing is not needed before receiving a dose. If a female is found to be pregnant after receiving a dose, no treatment is needed. In that case, the remaining doses should be  delayed until after the pregnancy. Immunization is recommended for any person with an immunocompromised condition through the age  of 26 years if she did not get any or all doses earlier. During the 3-dose series, the second dose should be obtained 4 8 weeks after the first dose. The third dose should be obtained 24 weeks after the first dose and 16 weeks after the second dose.  Zoster vaccine. One dose is recommended for adults aged 46 years or older unless certain conditions are present.  Measles, mumps, and rubella (MMR) vaccine. Adults born before [birth year] generally are considered immune to measles and mumps. Adults born in [birth year] or later should have 1 or more doses of MMR vaccine unless there is a contraindication to the vaccine or there is laboratory evidence of immunity to each of the three diseases. A routine second dose of MMR vaccine should be obtained at least 28 days after the first dose for students attending postsecondary schools, health care workers, or international travelers. People who received inactivated measles vaccine or an unknown type of measles vaccine during 1963 1967 should receive 2 doses of MMR vaccine. People who received inactivated mumps vaccine or an unknown type of mumps vaccine before 1979 and are at high risk for mumps infection should consider immunization with 2 doses of MMR vaccine. For females of childbearing age, rubella immunity should be determined. If there is no evidence of immunity, females who are not pregnant should be vaccinated. If there is no evidence of immunity, females who are pregnant should delay immunization until after pregnancy. Unvaccinated health care workers born before 50 who lack laboratory evidence of measles, mumps, or rubella immunity or laboratory confirmation of disease should consider measles and mumps immunization with 2 doses of MMR vaccine or rubella immunization with 1 dose of MMR vaccine.  Pneumococcal 13-valent conjugate (PCV13) vaccine.  When indicated, a person who is uncertain of her immunization history and has no record of immunization should receive the PCV13 vaccine. An adult aged 52 years or older who has certain medical conditions and has not been previously immunized should receive 1 dose of PCV13 vaccine. This PCV13 should be followed with a dose of pneumococcal polysaccharide (PPSV23) vaccine. The PPSV23 vaccine dose should be obtained at least 8 weeks after the dose of PCV13 vaccine. An adult aged 35 years or older who has certain medical conditions and previously received 1 or more doses of PPSV23 vaccine should receive 1 dose of PCV13. The PCV13 vaccine dose should be obtained 1 or more years after the last PPSV23 vaccine dose.  Pneumococcal polysaccharide (PPSV23) vaccine. When PCV13 is also indicated, PCV13 should be obtained first. All adults aged 71 years and older should be immunized. An adult younger than age 28 years who has certain medical conditions should be immunized. Any person who resides in a nursing home or long-term care facility should be immunized. An adult smoker should be immunized. People with an immunocompromised condition and certain other conditions should receive both PCV13 and PPSV23 vaccines. People with human immunodeficiency virus (HIV) infection should be immunized as soon as possible after diagnosis. Immunization during chemotherapy or radiation therapy should be avoided. Routine use of PPSV23 vaccine is not recommended for American Indians, [place] Natives, or people younger than 65 years unless there are medical conditions that require PPSV23 vaccine. When indicated, people who have unknown immunization and have no record of immunization should receive PPSV23 vaccine. One-time revaccination 5 years after the first dose of PPSV23 is recommended for people aged 33 64 years who have chronic kidney failure, nephrotic syndrome, asplenia, or immunocompromised conditions. People who received  1 2 doses of  PPSV23 before age 67 years should receive another dose of PPSV23 vaccine at age 67 years or later if at least 5 years have passed since the previous dose. Doses of PPSV23 are not needed for people immunized with PPSV23 at or after age 43 years.  Meningococcal vaccine. Adults with asplenia or persistent complement component deficiencies should receive 2 doses of quadrivalent meningococcal conjugate (MenACWY-D) vaccine. The doses should be obtained at least 2 months apart. Microbiologists working with certain meningococcal bacteria, [employer], people at risk during an outbreak, and people who travel to or live in countries with a high rate of meningitis should be immunized. A first-year college student up through age 70 years who is living in a residence hall should receive a dose if she did not receive a dose on or after her 16th birthday. Adults who have certain high-risk conditions should receive one or more doses of vaccine.  Hepatitis A vaccine. Adults who wish to be protected from this disease, have certain high-risk conditions, work with hepatitis A-infected animals, work in hepatitis A research labs, or travel to or work in countries with a high rate of hepatitis A should be immunized. Adults who were previously unvaccinated and who anticipate close contact with an international adoptee during the first 60 days after arrival in the [place] from a country with a high rate of hepatitis A should be immunized.  Hepatitis B vaccine. Adults who wish to be protected from this disease, have certain high-risk conditions, may be exposed to blood or other infectious body fluids, are household contacts or sex partners of hepatitis B positive people, are clients or workers in certain care facilities, or travel to or work in countries with a high rate of hepatitis B should be immunized.  Haemophilus influenzae type b (Hib) vaccine. A previously unvaccinated person with asplenia or sickle cell disease  or having a scheduled splenectomy should receive 1 dose of Hib vaccine. Regardless of previous immunization, a recipient of a hematopoietic stem cell transplant should receive a 3-dose series 6 12 months after her successful transplant. Hib vaccine is not recommended for adults with HIV infection. Preventive Services / Frequency Ages 9 to 39years  Blood pressure check.** / Every 1 to 2 years.  Lipid and cholesterol check.** / Every 5 years beginning at age 85.  Clinical breast exam.** / Every 3 years for women in their 56s and 33s.  BRCA-related cancer risk assessment.** / For women who have family members with a BRCA-related cancer (breast, ovarian, tubal, or peritoneal cancers).  Pap test.** / Every 2 years from ages 5 through 91. Every 3 years starting at age 61 through age 17 or 48 with a history of 3 consecutive normal Pap tests.  HPV screening.** / Every 3 years from ages 40 through ages 48 to 98 with a history of 3 consecutive normal Pap tests.  Hepatitis C blood test.** / For any individual with known risks for hepatitis C.  Skin self-exam. / Monthly.  Influenza vaccine. / Every year.  Tetanus, diphtheria, and acellular pertussis (Tdap, Td) vaccine.** / Consult your health care provider. Pregnant women should receive 1 dose of Tdap vaccine during each pregnancy. 1 dose of Td every 10 years.  Varicella vaccine.** / Consult your health care provider. Pregnant females who do not have evidence of immunity should receive the first dose after pregnancy.  HPV vaccine. / 3 doses over 6 months, if 49 and younger. The vaccine is not recommended for  use in pregnant females. However, pregnancy testing is not needed before receiving a dose.  Measles, mumps, rubella (MMR) vaccine.** / You need at least 1 dose of MMR if you were born in 1957 or later. You may also need a 2nd dose. For females of childbearing age, rubella immunity should be determined. If there is no evidence of immunity,  females who are not pregnant should be vaccinated. If there is no evidence of immunity, females who are pregnant should delay immunization until after pregnancy.  Pneumococcal 13-valent conjugate (PCV13) vaccine.** / Consult your health care provider.  Pneumococcal polysaccharide (PPSV23) vaccine.** / 1 to 2 doses if you smoke cigarettes or if you have certain conditions.  Meningococcal vaccine.** / 1 dose if you are age 17 to 26 years and a Market researcher living in a residence hall, or have one of several medical conditions, you need to get vaccinated against meningococcal disease. You may also need additional booster doses.  Hepatitis A vaccine.** / Consult your health care provider.  Hepatitis B vaccine.** / Consult your health care provider.  Haemophilus influenzae type b (Hib) vaccine.** / Consult your health care provider. Ages 47 to 64years  Blood pressure check.** / Every 1 to 2 years.  Lipid and cholesterol check.** / Every 5 years beginning at age 61 years.  Lung cancer screening. / Every year if you are aged 80 80 years and have a 30-pack-year history of smoking and currently smoke or have quit within the past 15 years. Yearly screening is stopped once you have quit smoking for at least 15 years or develop a health problem that would prevent you from having lung cancer treatment.  Clinical breast exam.** / Every year after age 34 years.  BRCA-related cancer risk assessment.** / For women who have family members with a BRCA-related cancer (breast, ovarian, tubal, or peritoneal cancers).  Mammogram.** / Every year beginning at age 55 years and continuing for as long as you are in good health. Consult with your health care provider.  Pap test.** / Every 3 years starting at age 41 years through age 54 or 11 years with a history of 3 consecutive normal Pap tests.  HPV screening.** / Every 3 years from ages 33 years through ages 49 to 3 years with a history of 3  consecutive normal Pap tests.  Fecal occult blood test (FOBT) of stool. / Every year beginning at age 68 years and continuing until age 34 years. You may not need to do this test if you get a colonoscopy every 10 years.  Flexible sigmoidoscopy or colonoscopy.** / Every 5 years for a flexible sigmoidoscopy or every 10 years for a colonoscopy beginning at age 73 years and continuing until age 44 years.  Hepatitis C blood test.** / For all people born from [birth year] through 1963 and any individual with known risks for hepatitis C.  Skin self-exam. / Monthly.  Influenza vaccine. / Every year.  Tetanus, diphtheria, and acellular pertussis (Tdap/Td) vaccine.** / Consult your health care provider. Pregnant women should receive 1 dose of Tdap vaccine during each pregnancy. 1 dose of Td every 10 years.  Varicella vaccine.** / Consult your health care provider. Pregnant females who do not have evidence of immunity should receive the first dose after pregnancy.  Zoster vaccine.** / 1 dose for adults aged 89 years or older.  Measles, mumps, rubella (MMR) vaccine.** / You need at least 1 dose of MMR if you were born in 1957 or later. You may also  need a 2nd dose. For females of childbearing age, rubella immunity should be determined. If there is no evidence of immunity, females who are not pregnant should be vaccinated. If there is no evidence of immunity, females who are pregnant should delay immunization until after pregnancy.  Pneumococcal 13-valent conjugate (PCV13) vaccine.** / Consult your health care provider.  Pneumococcal polysaccharide (PPSV23) vaccine.** / 1 to 2 doses if you smoke cigarettes or if you have certain conditions.  Meningococcal vaccine.** / Consult your health care provider.  Hepatitis A vaccine.** / Consult your health care provider.  Hepatitis B vaccine.** / Consult your health care provider.  Haemophilus influenzae type b (Hib) vaccine.** / Consult your health care  provider. Ages 10 years and over  Blood pressure check.** / Every 1 to 2 years.  Lipid and cholesterol check.** / Every 5 years beginning at age 79 years.  Lung cancer screening. / Every year if you are aged 48 80 years and have a 30-pack-year history of smoking and currently smoke or have quit within the past 15 years. Yearly screening is stopped once you have quit smoking for at least 15 years or develop a health problem that would prevent you from having lung cancer treatment.  Clinical breast exam.** / Every year after age 78 years.  BRCA-related cancer risk assessment.** / For women who have family members with a BRCA-related cancer (breast, ovarian, tubal, or peritoneal cancers).  Mammogram.** / Every year beginning at age 65 years and continuing for as long as you are in good health. Consult with your health care provider.  Pap test.** / Every 3 years starting at age 32 years through age 37 or 76 years with 3 consecutive normal Pap tests. Testing can be stopped between 65 and 70 years with 3 consecutive normal Pap tests and no abnormal Pap or HPV tests in the past 10 years.  HPV screening.** / Every 3 years from ages 28 years through ages 60 or 27 years with a history of 3 consecutive normal Pap tests. Testing can be stopped between 65 and 70 years with 3 consecutive normal Pap tests and no abnormal Pap or HPV tests in the past 10 years.  Fecal occult blood test (FOBT) of stool. / Every year beginning at age 27 years and continuing until age 52 years. You may not need to do this test if you get a colonoscopy every 10 years.  Flexible sigmoidoscopy or colonoscopy.** / Every 5 years for a flexible sigmoidoscopy or every 10 years for a colonoscopy beginning at age 55 years and continuing until age 50 years.  Hepatitis C blood test.** / For all people born from [birth year] through 1963 and any individual with known risks for hepatitis C.  Osteoporosis screening.** / A one-time screening for  women ages 11 years and over and women at risk for fractures or osteoporosis.  Skin self-exam. / Monthly.  Influenza vaccine. / Every year.  Tetanus, diphtheria, and acellular pertussis (Tdap/Td) vaccine.** / 1 dose of Td every 10 years.  Varicella vaccine.** / Consult your health care provider.  Zoster vaccine.** / 1 dose for adults aged 23 years or older.  Pneumococcal 13-valent conjugate (PCV13) vaccine.** / Consult your health care provider.  Pneumococcal polysaccharide (PPSV23) vaccine.** / 1 dose for all adults aged 54 years and older.  Meningococcal vaccine.** / Consult your health care provider.  Hepatitis A vaccine.** / Consult your health care provider.  Hepatitis B vaccine.** / Consult your health care provider.  Haemophilus influenzae type b (  Hib) vaccine.** / Consult your health care provider. ** Family history and personal history of risk and conditions may change your health care provider's recommendations. Document Released: 10/19/2001 Document Revised: 06/13/2013 Document Reviewed: 01/18/2011 [facility] Patient Information 2014 [place], [place].

## 2013-10-12 ENCOUNTER — Encounter: Payer: Self-pay | Admitting: Family Medicine

## 2013-10-12 NOTE — Progress Notes (Signed)
Quick Note:  Tell patient PAP is normal. ______ 

## 2014-09-13 DIAGNOSIS — S060X9A Concussion with loss of consciousness of unspecified duration, initial encounter: Secondary | ICD-10-CM

## 2014-09-13 DIAGNOSIS — S060XAA Concussion with loss of consciousness status unknown, initial encounter: Secondary | ICD-10-CM

## 2014-09-13 HISTORY — DX: Concussion with loss of consciousness status unknown, initial encounter: S06.0XAA

## 2014-09-13 HISTORY — DX: Concussion with loss of consciousness of unspecified duration, initial encounter: S06.0X9A

## 2015-08-19 ENCOUNTER — Telehealth: Payer: Self-pay | Admitting: Endocrinology

## 2015-08-19 NOTE — Telephone Encounter (Signed)
DR Apolonio Schneiders stated that Dr Dwyane Dee said in there meeting he would see his daughter this coming Friday at 2:30, and if so Monique Frazier would have to put the appointment in it's a provider requested slot. Please advise

## 2015-08-19 NOTE — Telephone Encounter (Signed)
Please see below and advise Colletta [place] if okay to schedule?

## 2015-08-22 ENCOUNTER — Encounter: Payer: Self-pay | Admitting: Endocrinology

## 2015-08-22 ENCOUNTER — Ambulatory Visit (INDEPENDENT_AMBULATORY_CARE_PROVIDER_SITE_OTHER): Payer: Managed Care, Other (non HMO) | Admitting: Endocrinology

## 2015-08-22 VITALS — BP 112/68 | HR 82 | Temp 97.6°F | Resp 14 | Ht 69.75 in | Wt 170.6 lb

## 2015-08-22 DIAGNOSIS — C73 Malignant neoplasm of thyroid gland: Secondary | ICD-10-CM | POA: Diagnosis not present

## 2015-08-22 NOTE — Progress Notes (Signed)
Patient ID: Monique Frazier, female   DOB: Mar 22, 1986, 29 y.o.   MRN: [id number]           Reason for Appointment: Evaluation of thyroid cancer    History of Present Illness:   The patient's thyroid nodule was first discovered on a routine physical exam in 07/2015 when the left lobe was felt to be enlarged.  The patient had not felt any swelling at that time More recently the patient thinks she can feel to swelling and has a little discomfort in the area.  She has no chokingor swallowing difficulty but her swallowing feels a little different No hoarseness. 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  She is now here for further management  Lab Results  Component Value Date   TSH 0.96 09/27/2012   TSH 0.95 01/20/2012   TSH 0.65 07/06/2010      Medication List       This list is accurate as of: 08/22/15 11:59 PM.  Always use your most recent med list.               ibuprofen 400 MG tablet  Commonly known as:  ADVIL,MOTRIN  Take 400 mg by mouth.     levonorgestrel 20 MCG/24HR IUD  Commonly known as:  MIRENA  1 each by Intrauterine route once.     meclizine 25 MG tablet  Commonly known as:  ANTIVERT  Take 25 mg by mouth 3 (three) times daily as needed for dizziness.     traMADol 50 MG tablet  Commonly known as:  ULTRAM  Take 50 mg by mouth every 6 (six) hours as needed.        Allergies: No Known Allergies  Past Medical History  Diagnosis Date  . Febrile seizure ([place])     at age 47.5 yrs  . Motor skills developmental delay   . Speech delay   . Premature birth     by 4 weeks twin pregnancy  . Kyphosis     mild consult Dr. Patrice Paradise in past  . Syncope     with neg ekg and eeg felt to be  .  Migraines     to avoid estrogen based hormones  . UTI (urinary tract infection)      Past Surgical History  Procedure Laterality Date  . Ovarian cyst surgery      Family History  Problem Relation Age of Onset  . Single kidney Brother   . Hyperlipidemia Father   . Thyroid cancer Paternal Aunt     Social History:  reports that she has never smoked. She does not have any smokeless tobacco history on file. She reports that she drinks alcohol. She reports that she does not use illicit drugs.   Review of Systems:  No history of recent weight change  No unusual fatigue, heat or cold intolerance  GI: No change in bowel habits    Has no change in menstrual cycles, currently Using IUD         Examination:   BP 112/68 mmHg  Pulse 82  Temp(Src) 97.6 F (36.4 C)  Resp 14  Ht 5' 9.75" (1.772 m)  Wt 170 lb 9.6 oz (77.384 kg)  BMI 24.64 kg/m2  SpO2 98%   General  Appearance:   Well-built and nourished, No pallor       Eyes: No prominence or swelling of the eyes         THYROID: Thyroid nodule on the left side is about 4 cm on exam and firm, smooth , felt medially. Has prominence of the left sternomastoid above this   Right lobe is enlarged about twice normal,  Less firm and distinct nodule is not felt  There is no lymphadenopathy in the neck  Heart sounds normal Lungs clear Abdomen shows no hepatosplenomegaly or other mass.    Reflexes at biceps are normal.  Skin: No rash  Extremities: No edema  Assessment/Plan:   PAPILLARY thyroid cancer:   This is a new diagnosis with bilateral lesions, larger on the left  Although the nodule on the left is 5 cm it is described as solid and cystic in size of the tumor is not clear currently  She reportedly is euthyroid with labs done with her PCP, results not available   Discussed general principles of management of thyroid cancer both surgical and with possible radioactive iodine treatment  Also discussed this will be followed  including thyroglobulin levels , scans Also discussed thyroid supplementation after surgery with levothyroxine  Discussed need for total thyroidectomy, also discussed potential risks with surgery including hypoparathyroidism and laryngeal nerve injury  She has been scheduled to see Dr. Harlow Asa on Monday and consult note today will be forwarded to him   Most likely considering her weight of 170 pounds she will need 137 g of levothyroxine    All the subjects were discussed with patient and she had no further questions    She will need follow-up in about 6 weeks added that time will review her pathology , checking thyroglobulin and thyroid levels and discuss further management   [facility] 08/23/2015

## 2015-08-23 ENCOUNTER — Encounter: Payer: Self-pay | Admitting: Endocrinology

## 2015-08-25 ENCOUNTER — Ambulatory Visit: Payer: Self-pay | Admitting: Surgery

## 2015-08-27 ENCOUNTER — Encounter (HOSPITAL_COMMUNITY): Payer: Self-pay | Admitting: Surgery

## 2015-08-27 DIAGNOSIS — C73 Malignant neoplasm of thyroid gland: Secondary | ICD-10-CM | POA: Diagnosis present

## 2015-08-27 MED ORDER — CEFAZOLIN SODIUM-DEXTROSE 2-3 GM-% IV SOLR
2.0000 g | INTRAVENOUS | Status: AC
  Administered 2015-08-28: 2 g via INTRAVENOUS
  Filled 2015-08-27: qty 50

## 2015-08-27 NOTE — H&P (Signed)
General Surgery [facility]  Monique Frazier DOB: 07-27-1986 Married / Language: English / Race: White Female  History of Present Illness Patient words: thyroid.  The patient is a 29 year old female who presents with thyroid cancer.  Patient presents on referral from [place], [place], with newly diagnosed papillary thyroid carcinoma. Patient was noted on routine physical exam by her primary care provider to have a palpable mass in the left thyroid lobe. Patient subsequently underwent ultrasound showing bilateral thyroid nodules. Fine needle aspiration was performed and final pathology shows papillary thyroid carcinoma bilaterally. Patient has no prior history of thyroid disease. She has had no prior head or neck surgery. She has never been on thyroid medication. There is no family history of thyroid disease other than a paternal aunt who underwent thyroidectomy for thyroid carcinoma 40 years ago. There is no family history of other endocrine neoplasms. Patient denies any compressive symptoms. She denies tremors or palpitations. Patient has been seen by Dr. Elayne Snare in endocrinology. She will see him in follow-up after her surgery.  Other Problems Thyroid Cancer Thyroid Disease  Past Surgical History No pertinent past surgical history  Diagnostic Studies History Mammogram never  Allergies No Known Drug Allergies12/19/2016  Medication History Mirena (34 MG) (20MCG/24HR IUD, Intrauterine) Active. TraMADol HCl ER (100MG  Capsule ER 24HR, Oral) Active. Meclicot (12.5MG  Tablet, Oral) Active. Medications Reconciled  Social History Alcohol use Occasional alcohol use. Caffeine use Coffee. Illicit drug use Uses weekly. Tobacco use Current some day smoker.  Pregnancy / Birth History Age at menarche 5 years. Contraceptive History Intrauterine device. Gravida 0 Para 0 Regular periods  Review of Systems General Present-  Fatigue. Not Present- Appetite Loss, Chills, Fever, Night Sweats, Weight Gain and Weight Loss. Skin Not Present- Change in Wart/Mole, Dryness, Hives, Jaundice, New Lesions, Non-Healing Wounds, Rash and Ulcer. HEENT Not Present- Earache, Hearing Loss, Hoarseness, Nose Bleed, Oral Ulcers, Ringing in the Ears, Seasonal Allergies, Sinus Pain, Sore Throat, Visual Disturbances, Wears glasses/contact lenses and Yellow Eyes. Respiratory Not Present- Bloody sputum, Chronic Cough, Difficulty Breathing, Snoring and Wheezing. Cardiovascular Not Present- Chest Pain, Difficulty Breathing Lying Down, Leg Cramps, Palpitations, Rapid Heart Rate, Shortness of Breath and Swelling of Extremities. Gastrointestinal Not Present- Abdominal Pain, Bloating, Bloody Stool, Change in Bowel Habits, Chronic diarrhea, Constipation, Difficulty Swallowing, Excessive gas, Gets full quickly at meals, Hemorrhoids, Indigestion, Nausea, Rectal Pain and Vomiting. Female Genitourinary Not Present- Frequency, Nocturia, Painful Urination, Pelvic Pain and Urgency.  Vitals Weight: 171.6 lb Height: 70in Body Surface Area: 1.96 m Body Mass Index: 24.62 kg/m  Temp.: 97.4F(Oral)  Pulse: 107 (Regular)  BP: 122/82 (Sitting, Left Arm, Standard)   Physical Exam  General - appears comfortable, no distress; not diaphorectic  HEENT - normocephalic; sclerae clear, gaze conjugate; mucous membranes moist, dentition good; voice normal  Neck - asymmetric on extension; no palpable anterior or posterior cervical adenopathy; large firm dominant mass measuring approximately 4-5 cm in greatest dimension in the left thyroid lobe; mobile with swallowing; nontender; slight deviation of trachea to the right; right thyroid lobe without palpable abnormality  Chest - clear bilaterally without rhonchi, rales, or wheeze  Cor - regular rhythm with normal rate; no significant murmur  Ext - non-tender without significant edema or lymphedema  Neuro  - grossly intact; no tremor   Assessment & Plan  PAPILLARY THYROID CARCINOMA (C73)  The patient presents with newly diagnosed bilateral papillary thyroid carcinoma. She presents today accompanied by her parents and her fianc. They are provided  with written literature on thyroid surgery to review at home.  We reviewed her clinical history. Based on her pathology report, I have recommended total thyroidectomy with limited central compartment lymph node dissection. We discussed the risk and benefits of the procedure including the risk of recurrent laryngeal nerve injury and injury to parathyroid glands. We discussed the location of the surgical incision and cosmetic results to be anticipated. We discussed the hospital stay and the postoperative recovery to be anticipated. We discussed the need for lifelong thyroid hormone replacement. We discussed the potential need for radioactive iodine treatment. They understand and wish to proceed with surgery in the near future.  The risks and benefits of the procedure have been discussed at length with the patient. The patient understands the proposed procedure, potential alternative treatments, and the course of recovery to be expected. All of the patient's questions have been answered at this time. The patient wishes to proceed with surgery.  Earnstine Regal, MD, [facility] Office: [phone]

## 2015-08-27 NOTE — Progress Notes (Signed)
Pt states she had thyroid lab work done in [place] a few weeks ago and was told all was normal. She is going to call the clinic where it was done and have them fax the results to [place]. I gave her our fax #.

## 2015-08-28 ENCOUNTER — Observation Stay (HOSPITAL_COMMUNITY)
Admission: RE | Admit: 2015-08-28 | Discharge: 2015-08-29 | Disposition: A | Discharge status: Home / Self Care | Payer: Managed Care, Other (non HMO) | Source: Ambulatory Visit | Attending: Surgery | Admitting: Surgery

## 2015-08-28 ENCOUNTER — Encounter (HOSPITAL_COMMUNITY): Payer: Self-pay | Admitting: Critical Care Medicine

## 2015-08-28 ENCOUNTER — Ambulatory Visit (HOSPITAL_COMMUNITY): Payer: Managed Care, Other (non HMO) | Admitting: Anesthesiology

## 2015-08-28 ENCOUNTER — Ambulatory Visit: Payer: 59 | Admitting: Endocrinology

## 2015-08-28 ENCOUNTER — Encounter (HOSPITAL_COMMUNITY)
Admission: RE | Disposition: A | Discharge status: Home / Self Care | Payer: Self-pay | Source: Ambulatory Visit | Attending: Surgery

## 2015-08-28 ENCOUNTER — Ambulatory Visit (HOSPITAL_COMMUNITY): Payer: Managed Care, Other (non HMO)

## 2015-08-28 DIAGNOSIS — C73 Malignant neoplasm of thyroid gland: Principal | ICD-10-CM | POA: Insufficient documentation

## 2015-08-28 DIAGNOSIS — F17210 Nicotine dependence, cigarettes, uncomplicated: Secondary | ICD-10-CM | POA: Diagnosis not present

## 2015-08-28 DIAGNOSIS — Z01818 Encounter for other preprocedural examination: Secondary | ICD-10-CM

## 2015-08-28 DIAGNOSIS — C770 Secondary and unspecified malignant neoplasm of lymph nodes of head, face and neck: Secondary | ICD-10-CM | POA: Diagnosis not present

## 2015-08-28 HISTORY — PX: THYROIDECTOMY: SHX17

## 2015-08-28 HISTORY — DX: Malignant (primary) neoplasm, unspecified: C80.1

## 2015-08-28 HISTORY — DX: Unspecified ovarian cyst, right side: N83.201

## 2015-08-28 HISTORY — DX: Unspecified ovarian cyst, left side: N83.202

## 2015-08-28 HISTORY — DX: Concussion with loss of consciousness of unspecified duration, initial encounter: S06.0X9A

## 2015-08-28 LAB — CBC
HEMATOCRIT: 45.8 % (ref 36.0–46.0)
HEMOGLOBIN: 15.6 g/dL — AB (ref 12.0–15.0)
MCH: 30.9 pg (ref 26.0–34.0)
MCHC: 34.1 g/dL (ref 30.0–36.0)
MCV: 90.7 fL (ref 78.0–100.0)
Platelets: 200 10*3/uL (ref 150–400)
RBC: 5.05 MIL/uL (ref 3.87–5.11)
RDW: 12.7 % (ref 11.5–15.5)
WBC: 7.6 10*3/uL (ref 4.0–10.5)

## 2015-08-28 LAB — BASIC METABOLIC PANEL
ANION GAP: 13 (ref 5–15)
BUN: 16 mg/dL (ref 6–20)
CHLORIDE: 105 mmol/L (ref 101–111)
CO2: 23 mmol/L (ref 22–32)
Calcium: 10.1 mg/dL (ref 8.9–10.3)
Creatinine, Ser: 0.87 mg/dL (ref 0.44–1.00)
GFR calc Af Amer: >60 mL/min (ref >60)
GLUCOSE: 92 mg/dL (ref 65–99)
POTASSIUM: 3.5 mmol/L (ref 3.5–5.1)
Sodium: 141 mmol/L (ref 135–145)

## 2015-08-28 LAB — HCG, SERUM, QUALITATIVE: Preg, Serum: NEGATIVE

## 2015-08-28 SURGERY — THYROIDECTOMY
Anesthesia: General | Site: Neck

## 2015-08-28 MED ORDER — ONDANSETRON HCL 4 MG/2ML IJ SOLN
INTRAMUSCULAR | Status: AC
  Filled 2015-08-28: qty 2

## 2015-08-28 MED ORDER — HYDROMORPHONE HCL 1 MG/ML IJ SOLN
INTRAMUSCULAR | Status: AC
  Administered 2015-08-28: 0.5 mg via INTRAVENOUS
  Filled 2015-08-28: qty 1

## 2015-08-28 MED ORDER — ACETAMINOPHEN 325 MG PO TABS: 650.0000 mg | ORAL_TABLET | Freq: Four times a day (QID) | ORAL | Status: DC | PRN

## 2015-08-28 MED ORDER — DEXAMETHASONE SODIUM PHOSPHATE 10 MG/ML IJ SOLN
INTRAMUSCULAR | Status: DC | PRN
  Administered 2015-08-28: 10 mg via INTRAVENOUS

## 2015-08-28 MED ORDER — LIDOCAINE HCL (CARDIAC) 20 MG/ML IV SOLN
INTRAVENOUS | Status: AC
  Filled 2015-08-28: qty 5

## 2015-08-28 MED ORDER — PROPOFOL 10 MG/ML IV BOLUS
INTRAVENOUS | Status: DC | PRN
  Administered 2015-08-28: 120 mg via INTRAVENOUS
  Administered 2015-08-28: 10 mg via INTRAVENOUS
  Administered 2015-08-28: 20 mg via INTRAVENOUS

## 2015-08-28 MED ORDER — ONDANSETRON HCL 4 MG/2ML IJ SOLN
4.0000 mg | Freq: Four times a day (QID) | INTRAMUSCULAR | Status: DC | PRN
  Administered 2015-08-28: 4 mg via INTRAVENOUS
  Filled 2015-08-28: qty 2

## 2015-08-28 MED ORDER — TRAMADOL HCL 50 MG PO TABS: 50.0000 mg | ORAL_TABLET | Freq: Four times a day (QID) | ORAL | Status: DC | PRN

## 2015-08-28 MED ORDER — ONDANSETRON HCL 4 MG/2ML IJ SOLN: 4.0000 mg | Freq: Once | INTRAMUSCULAR | Status: DC | PRN

## 2015-08-28 MED ORDER — ROCURONIUM BROMIDE 50 MG/5ML IV SOLN
INTRAVENOUS | Status: AC
  Filled 2015-08-28: qty 1

## 2015-08-28 MED ORDER — FENTANYL CITRATE (PF) 250 MCG/5ML IJ SOLN
INTRAMUSCULAR | Status: AC
  Filled 2015-08-28: qty 5

## 2015-08-28 MED ORDER — 0.9 % SODIUM CHLORIDE (POUR BTL) OPTIME
TOPICAL | Status: DC | PRN
  Administered 2015-08-28: 1000 mL

## 2015-08-28 MED ORDER — SUGAMMADEX SODIUM 200 MG/2ML IV SOLN
INTRAVENOUS | Status: AC
  Filled 2015-08-28: qty 2

## 2015-08-28 MED ORDER — ONDANSETRON HCL 4 MG/2ML IJ SOLN
INTRAMUSCULAR | Status: DC | PRN
  Administered 2015-08-28: 4 mg via INTRAVENOUS

## 2015-08-28 MED ORDER — KCL IN DEXTROSE-NACL 20-5-0.45 MEQ/L-%-% IV SOLN
INTRAVENOUS | Status: DC
  Administered 2015-08-28: 22:00:00 via INTRAVENOUS
  Filled 2015-08-28: qty 1000

## 2015-08-28 MED ORDER — HYDROMORPHONE HCL 1 MG/ML IJ SOLN
0.2500 mg | INTRAMUSCULAR | Status: DC | PRN
  Administered 2015-08-28 (×4): 0.5 mg via INTRAVENOUS

## 2015-08-28 MED ORDER — ARTIFICIAL TEARS OP OINT
TOPICAL_OINTMENT | OPHTHALMIC | Status: DC | PRN
  Administered 2015-08-28: 1 via OPHTHALMIC

## 2015-08-28 MED ORDER — PROPOFOL 10 MG/ML IV BOLUS
INTRAVENOUS | Status: AC
  Filled 2015-08-28: qty 20

## 2015-08-28 MED ORDER — LACTATED RINGERS IV SOLN
INTRAVENOUS | Status: DC
  Administered 2015-08-28 (×2): via INTRAVENOUS

## 2015-08-28 MED ORDER — ACETAMINOPHEN 650 MG RE SUPP: 650.0000 mg | Freq: Four times a day (QID) | RECTAL | Status: DC | PRN

## 2015-08-28 MED ORDER — SUGAMMADEX SODIUM 200 MG/2ML IV SOLN
INTRAVENOUS | Status: DC | PRN
  Administered 2015-08-28: 200 mg via INTRAVENOUS

## 2015-08-28 MED ORDER — HYDROCODONE-ACETAMINOPHEN 5-325 MG PO TABS
1.0000 | ORAL_TABLET | ORAL | Status: DC | PRN
  Administered 2015-08-28 – 2015-08-29 (×3): 2 via ORAL
  Filled 2015-08-28 (×4): qty 2

## 2015-08-28 MED ORDER — ROCURONIUM BROMIDE 100 MG/10ML IV SOLN
INTRAVENOUS | Status: DC | PRN
  Administered 2015-08-28: 40 mg via INTRAVENOUS
  Administered 2015-08-28: 10 mg via INTRAVENOUS

## 2015-08-28 MED ORDER — DEXAMETHASONE SODIUM PHOSPHATE 10 MG/ML IJ SOLN
INTRAMUSCULAR | Status: AC
  Filled 2015-08-28: qty 1

## 2015-08-28 MED ORDER — MEPERIDINE HCL 25 MG/ML IJ SOLN: 6.2500 mg | INTRAMUSCULAR | Status: DC | PRN

## 2015-08-28 MED ORDER — HYDROMORPHONE HCL 1 MG/ML IJ SOLN
1.0000 mg | INTRAMUSCULAR | Status: DC | PRN
  Administered 2015-08-29: 1 mg via INTRAVENOUS
  Filled 2015-08-28: qty 1

## 2015-08-28 MED ORDER — MIDAZOLAM HCL 2 MG/2ML IJ SOLN
INTRAMUSCULAR | Status: AC
  Filled 2015-08-28: qty 2

## 2015-08-28 MED ORDER — ONDANSETRON 4 MG PO TBDP: 4.0000 mg | ORAL_TABLET | Freq: Four times a day (QID) | ORAL | Status: DC | PRN

## 2015-08-28 MED ORDER — MECLIZINE HCL 25 MG PO TABS
25.0000 mg | ORAL_TABLET | Freq: Three times a day (TID) | ORAL | Status: DC | PRN
Start: 2015-08-28 — End: 2015-08-29
  Filled 2015-08-28: qty 1

## 2015-08-28 MED ORDER — ARTIFICIAL TEARS OP OINT
TOPICAL_OINTMENT | OPHTHALMIC | Status: AC
  Filled 2015-08-28: qty 3.5

## 2015-08-28 MED ORDER — LIDOCAINE HCL (CARDIAC) 20 MG/ML IV SOLN
INTRAVENOUS | Status: DC | PRN
  Administered 2015-08-28: 100 mg via INTRATRACHEAL
  Administered 2015-08-28: 100 mg via INTRAVENOUS

## 2015-08-28 MED ORDER — HEMOSTATIC AGENTS (NO CHARGE) OPTIME
TOPICAL | Status: DC | PRN
  Administered 2015-08-28: 1 via TOPICAL

## 2015-08-28 MED ORDER — CALCIUM CARBONATE 1250 (500 CA) MG PO TABS
2.0000 | ORAL_TABLET | Freq: Three times a day (TID) | ORAL | Status: DC
  Administered 2015-08-29: 1000 mg via ORAL
  Filled 2015-08-28 (×2): qty 1

## 2015-08-28 MED ORDER — FENTANYL CITRATE (PF) 100 MCG/2ML IJ SOLN
INTRAMUSCULAR | Status: DC | PRN
  Administered 2015-08-28: 100 ug via INTRAVENOUS
  Administered 2015-08-28 (×2): 25 ug via INTRAVENOUS
  Administered 2015-08-28 (×2): 50 ug via INTRAVENOUS

## 2015-08-28 MED ORDER — MIDAZOLAM HCL 5 MG/5ML IJ SOLN
INTRAMUSCULAR | Status: DC | PRN
  Administered 2015-08-28: 2 mg via INTRAVENOUS

## 2015-08-28 SURGICAL SUPPLY — 57 items
APL SKNCLS STERI-STRIP NONHPOA (GAUZE/BANDAGES/DRESSINGS) ×1
ATTRACTOMAT 16X20 MAGNETIC DRP (DRAPES) ×2 IMPLANT
BENZOIN TINCTURE PRP APPL 2/3 (GAUZE/BANDAGES/DRESSINGS) ×2 IMPLANT
BLADE SURG 10 STRL SS (BLADE) ×2 IMPLANT
BLADE SURG 15 STRL LF DISP TIS (BLADE) ×1 IMPLANT
BLADE SURG 15 STRL SS (BLADE) ×2
BLADE SURG ROTATE 9660 (MISCELLANEOUS) IMPLANT
CANISTER SUCTION 2500CC (MISCELLANEOUS) ×2 IMPLANT
CHLORAPREP W/TINT 10.5 ML (MISCELLANEOUS) ×2 IMPLANT
CLIP TI MEDIUM 24 (CLIP) ×2 IMPLANT
CLIP TI WIDE RED SMALL 24 (CLIP) ×4 IMPLANT
CONT SPEC 4OZ CLIKSEAL STRL BL (MISCELLANEOUS) ×4 IMPLANT
COVER SURGICAL LIGHT HANDLE (MISCELLANEOUS) ×2 IMPLANT
CRADLE DONUT ADULT HEAD (MISCELLANEOUS) ×2 IMPLANT
DRAPE LAPAROTOMY 100X72 PEDS (DRAPES) ×2 IMPLANT
DRAPE PED LAPAROTOMY (DRAPES) ×2 IMPLANT
DRAPE UTILITY XL STRL (DRAPES) ×2 IMPLANT
ELECT CAUTERY BLADE 6.4 (BLADE) ×2 IMPLANT
ELECT REM PT RETURN 9FT ADLT (ELECTROSURGICAL) ×2
ELECTRODE REM PT RTRN 9FT ADLT (ELECTROSURGICAL) ×1 IMPLANT
GAUZE SPONGE 4X4 12PLY STRL (GAUZE/BANDAGES/DRESSINGS) ×2 IMPLANT
GAUZE SPONGE 4X4 16PLY XRAY LF (GAUZE/BANDAGES/DRESSINGS) ×2 IMPLANT
GLOVE BIO SURGEON STRL SZ7 (GLOVE) ×2 IMPLANT
GLOVE BIOGEL PI IND STRL 6.5 (GLOVE) ×1 IMPLANT
GLOVE BIOGEL PI IND STRL 7.0 (GLOVE) ×2 IMPLANT
GLOVE BIOGEL PI IND STRL 7.5 (GLOVE) ×1 IMPLANT
GLOVE BIOGEL PI INDICATOR 6.5 (GLOVE) ×1
GLOVE BIOGEL PI INDICATOR 7.0 (GLOVE) ×2
GLOVE BIOGEL PI INDICATOR 7.5 (GLOVE) ×1
GLOVE SKINSENSE NS SZ7.0 (GLOVE) ×1
GLOVE SKINSENSE STRL SZ6.0 (GLOVE) ×2 IMPLANT
GLOVE SKINSENSE STRL SZ7.0 (GLOVE) ×1 IMPLANT
GLOVE SURG ORTHO 8.0 STRL STRW (GLOVE) ×2 IMPLANT
GOWN STRL REUS W/ TWL LRG LVL3 (GOWN DISPOSABLE) ×2 IMPLANT
GOWN STRL REUS W/ TWL XL LVL3 (GOWN DISPOSABLE) ×1 IMPLANT
GOWN STRL REUS W/TWL LRG LVL3 (GOWN DISPOSABLE) ×4
GOWN STRL REUS W/TWL XL LVL3 (GOWN DISPOSABLE) ×2
HEMOSTAT SURGICEL 2X4 FIBR (HEMOSTASIS) ×2 IMPLANT
KIT BASIN OR (CUSTOM PROCEDURE TRAY) ×2 IMPLANT
KIT ROOM TURNOVER OR (KITS) ×2 IMPLANT
NS IRRIG 1000ML POUR BTL (IV SOLUTION) ×2 IMPLANT
PACK SURGICAL SETUP 50X90 (CUSTOM PROCEDURE TRAY) ×2 IMPLANT
PAD ARMBOARD 7.5X6 YLW CONV (MISCELLANEOUS) ×2 IMPLANT
PENCIL BUTTON HOLSTER BLD 10FT (ELECTRODE) ×2 IMPLANT
SHEARS HARMONIC 9CM CVD (BLADE) ×2 IMPLANT
SPECIMEN JAR MEDIUM (MISCELLANEOUS) IMPLANT
SPONGE INTESTINAL PEANUT (DISPOSABLE) ×2 IMPLANT
STRIP CLOSURE SKIN 1/2X4 (GAUZE/BANDAGES/DRESSINGS) ×2 IMPLANT
SUT MNCRL AB 4-0 PS2 18 (SUTURE) ×2 IMPLANT
SUT SILK 2 0 (SUTURE) ×2
SUT SILK 2-0 18XBRD TIE 12 (SUTURE) ×1 IMPLANT
SUT VIC AB 3-0 SH 18 (SUTURE) ×6 IMPLANT
SYR BULB 3OZ (MISCELLANEOUS) ×2 IMPLANT
TAPE CLOTH SURG 4X10 WHT LF (GAUZE/BANDAGES/DRESSINGS) ×2 IMPLANT
TOWEL OR 17X24 6PK STRL BLUE (TOWEL DISPOSABLE) ×2 IMPLANT
TOWEL OR 17X26 10 PK STRL BLUE (TOWEL DISPOSABLE) ×2 IMPLANT
TUBE CONNECTING 12X1/4 (SUCTIONS) ×2 IMPLANT

## 2015-08-28 NOTE — Interval H&P Note (Signed)
History and Physical Interval Note:  08/28/2015 2:04 PM  Monique Frazier  has presented today for surgery, with the diagnosis of papillary thyroid carcinoma.  The various methods of treatment have been discussed with the patient and family. After consideration of risks, benefits and other options for treatment, the patient has consented to    Procedure(s): TOTAL THYROIDECTOMY WITH LIMITED LYMPH NODE DISSECTION (N/A) as a surgical intervention .    The patient's history has been reviewed, patient examined, no change in status, stable for surgery.  I have reviewed the patient's chart and labs.  Questions were answered to the patient's satisfaction.    Earnstine Regal, MD, [facility] Office: [place]

## 2015-08-28 NOTE — Transfer of Care (Signed)
Immediate Anesthesia Transfer of Care Note  Patient: Monique Frazier  Procedure(s) Performed: Procedure(s): TOTAL THYROIDECTOMY WITH LIMITED LYMPH NODE DISSECTION (N/A)  Patient Location: PACU  Anesthesia Type:General  Level of Consciousness: awake, alert  and oriented  Airway & Oxygen Therapy: Patient Spontanous Breathing and Patient connected to nasal cannula oxygen  Post-op Assessment: Report given to RN, Post -op Vital signs reviewed and stable and Patient moving all extremities X 4  Post vital signs: Reviewed and stable  Last Vitals:  Filed Vitals:   08/28/15 1307  BP: 133/74  Pulse: 106  Temp: 36.8 C  Resp: 16    Complications: No apparent anesthesia complications

## 2015-08-28 NOTE — Anesthesia Postprocedure Evaluation (Signed)
Anesthesia Post Note  Patient: Monique Frazier  Procedure(s) Performed: Procedure(s) (LRB): TOTAL THYROIDECTOMY WITH LIMITED LYMPH NODE DISSECTION (N/A)  Patient location during evaluation: PACU Anesthesia Type: General Level of consciousness: awake and alert Pain management: pain level controlled Vital Signs Assessment: post-procedure vital signs reviewed and stable Respiratory status: spontaneous breathing, nonlabored ventilation, respiratory function stable and patient connected to nasal cannula oxygen Cardiovascular status: blood pressure returned to baseline and stable Postop Assessment: no signs of nausea or vomiting Anesthetic complications: no    Last Vitals:  Filed Vitals:   08/28/15 1629 08/28/15 1630  BP:  143/85  Pulse:  97  Temp: 36.6 C   Resp:  14    Last Pain:  Filed Vitals:   08/28/15 1645  PainSc: 6                  Susie Ehresman DAVID

## 2015-08-28 NOTE — Anesthesia Procedure Notes (Signed)
Procedure Name: Intubation Date/Time: 08/28/2015 2:29 PM Performed by: Merrilyn Puma B Pre-anesthesia Checklist: Patient identified, Emergency Drugs available, Suction available, Timeout performed and Patient being monitored Patient Re-evaluated:Patient Re-evaluated prior to inductionOxygen Delivery Method: Circle system utilized Preoxygenation: Pre-oxygenation with 100% oxygen Intubation Type: IV induction Ventilation: Mask ventilation without difficulty Laryngoscope Size: Mac and 3 Grade View: Grade II Tube type: Oral Tube size: 7.0 mm Number of attempts: 1 Airway Equipment and Method: Stylet and LTA kit utilized Placement Confirmation: CO2 detector,  positive ETCO2,  ETT inserted through vocal cords under direct vision and breath sounds checked- equal and bilateral Secured at: 21 cm Tube secured with: Tape Dental Injury: Teeth and Oropharynx as per pre-operative assessment

## 2015-08-28 NOTE — Op Note (Addendum)
Procedure Note  Pre-operative Diagnosis:  Bilateral papillary thyroid carcinoma  Post-operative Diagnosis:  same  Surgeon:  Earnstine Regal, MD, FACS  Assistant:  none   Procedure:  1. Total thyroidectomy  2. Limited central compartment (Zone VI) lymph node dissection  Anesthesia:  General  Estimated Blood Loss:  minimal  Drains: none         Specimen: thyroid to pathology  Indications:  Patient presents on referral from [place], [place], with newly diagnosed papillary thyroid carcinoma. Patient was noted on routine physical exam by her primary care provider to have a palpable mass in the left thyroid lobe. Patient subsequently underwent ultrasound showing bilateral thyroid nodules. Fine needle aspiration was performed and final pathology shows papillary thyroid carcinoma bilaterally. Patient has no prior history of thyroid disease.   Procedure Details: Procedure was done in OR #2 at the [facility].  The patient was brought to the operating room and placed in a supine position on the operating room table.  Following administration of general anesthesia, the patient was positioned and then prepped and draped in the usual aseptic fashion.  After ascertaining that an adequate level of anesthesia had been achieved, a Kocher incision was made with #15 blade.  Dissection was carried through subcutaneous tissues and platysma. Hemostasis was achieved with the electrocautery.  Skin flaps were elevated cephalad and caudad from the thyroid notch to the sternal notch.  The [place] self-retaining retractor was placed for exposure.  Strap muscles were incised in the midline and dissection was begun on the left side.  Strap muscles were reflected laterally.  Left thyroid lobe was markedly enlarged and heterogeneous.  The left lobe was gently mobilized with blunt dissection.  Superior pole vessels were dissected out and divided individually between small and medium Ligaclips with the  Harmonic scalpel.  The thyroid lobe was rolled anteriorly.  Branches of the inferior thyroid artery were divided between small Ligaclips with the Harmonic scalpel.  Inferior venous tributaries were divided between Ligaclips.  Both the superior and inferior parathyroid glands were identified and preserved on their vascular pedicles.  The recurrent laryngeal nerve was identified and preserved along its course.  The ligament of Gwenlyn Found was released with the electrocautery and the gland was mobilized onto the anterior trachea. Isthmus was mobilized across the midline.  There was a moderate sized pyramidal lobe present which was dissected off the thyroid cartilage and resected with the isthmus.  Dry pack was placed in the left neck.  Next, the right thyroid lobe was gently mobilized with blunt dissection.  Right thyroid lobe was normal in sized but nodular.  There was a firm nodule near the tubercle of Zuckerkandl which was adherent to the trachea.  Superior pole vessels were dissected out and divided between small and medium Ligaclips with the Harmonic scalpel.  Superior parathyroid was identified and preserved.  Inferior venous tributaries were divided between medium Ligaclips with the Harmonic scalpel.  The right thyroid lobe was rolled anteriorly and the branches of the inferior thyroid artery divided between small Ligaclips.  The right recurrent laryngeal nerve was identified and preserved along its course.  The ligament of Gwenlyn Found was released with the electrocautery.  The right thyroid lobe was mobilized onto the anterior trachea and the remainder of the thyroid was dissected off the anterior trachea and the thyroid was completely excised.  A suture was used to mark the left lobe. The entire thyroid gland was submitted to pathology for review.  Central compartment lymph  node bearing tissue was removed from between the right and left carotid arteries, extending inferiorly to near the innominate vein.  Hemostasis  was obtained with the electrocautery and ligaclips.  No obvious metastatic disease was identified on palpation.  Tissue was submitted to pathology separately.  The neck was irrigated with warm saline.  Fibrillar was placed throughout the operative field.  Strap muscles were reapproximated in the midline with interrupted 3-0 Vicryl sutures.  Platysma was closed with interrupted 3-0 Vicryl sutures.  Skin was closed with a running 4-0 Monocryl subcuticular suture.  Wound was washed and dried and benzoin and steri-strips were applied.  Dry gauze dressing was placed.  The patient was awakened from anesthesia and brought to the recovery room.  The patient tolerated the procedure well.   Earnstine Regal, MD, [facility] Office: [phone]

## 2015-08-28 NOTE — Anesthesia Preprocedure Evaluation (Signed)
Anesthesia Evaluation  Patient identified by MRN, date of birth, ID band Patient awake    Reviewed: Allergy & Precautions, NPO status , Patient's Chart, lab work & pertinent test results  Airway Mallampati: I  TM Distance: >3 FB Neck ROM: Full    Dental   Pulmonary    Pulmonary exam normal        Cardiovascular Normal cardiovascular exam     Neuro/Psych    GI/Hepatic   Endo/Other    Renal/GU      Musculoskeletal   Abdominal   Peds  Hematology   Anesthesia Other Findings   Reproductive/Obstetrics                             Anesthesia Physical Anesthesia Plan  ASA: II  Anesthesia Plan: General   Post-op Pain Management:    Induction: Intravenous  Airway Management Planned: Oral ETT  Additional Equipment:   Intra-op Plan:   Post-operative Plan: Extubation in OR  Informed Consent: I have reviewed the patients History and Physical, chart, labs and discussed the procedure including the risks, benefits and alternatives for the proposed anesthesia with the patient or authorized representative who has indicated his/her understanding and acceptance.     Plan Discussed with: CRNA and Surgeon  Anesthesia Plan Comments:         Anesthesia Quick Evaluation  

## 2015-08-29 ENCOUNTER — Encounter (HOSPITAL_COMMUNITY): Payer: Self-pay | Admitting: Surgery

## 2015-08-29 DIAGNOSIS — C73 Malignant neoplasm of thyroid gland: Secondary | ICD-10-CM | POA: Diagnosis not present

## 2015-08-29 LAB — BASIC METABOLIC PANEL
ANION GAP: 8 (ref 5–15)
BUN: 10 mg/dL (ref 6–20)
CALCIUM: 9.3 mg/dL (ref 8.9–10.3)
CO2: 29 mmol/L (ref 22–32)
Chloride: 103 mmol/L (ref 101–111)
Creatinine, Ser: 0.74 mg/dL (ref 0.44–1.00)
GFR calc non Af Amer: >60 mL/min (ref >60)
GLUCOSE: 148 mg/dL — AB (ref 65–99)
POTASSIUM: 4.3 mmol/L (ref 3.5–5.1)
Sodium: 140 mmol/L (ref 135–145)

## 2015-08-29 MED ORDER — HYDROCODONE-ACETAMINOPHEN 5-325 MG PO TABS: 1.0000 | ORAL_TABLET | ORAL | Status: AC | PRN

## 2015-08-29 MED ORDER — SYNTHROID 137 MCG PO TABS: 137.0000 ug | ORAL_TABLET | Freq: Every day | ORAL | Status: DC

## 2015-08-29 MED ORDER — CALCIUM CARBONATE 1250 (500 CA) MG PO TABS: 2.0000 | ORAL_TABLET | Freq: Two times a day (BID) | ORAL | Status: AC

## 2015-08-29 NOTE — Progress Notes (Signed)
Patient discharged to home with instructions. 

## 2015-08-29 NOTE — Discharge Summary (Signed)
Physician Discharge Summary [facility]  Patient ID: Monique Frazier MRN: [id number] DOB/AGE: 27-03-1986 29 y.o.  Admit date: 08/28/2015 Discharge date: 08/29/2015  Admission Diagnoses:  Papillary thyroid carcinoma  Discharge Diagnoses:  Principal Problem:   Papillary thyroid carcinoma [facility])   Discharged Condition: good  Hospital Course: Patient was admitted for observation following thyroid surgery.  Post op course was uncomplicated.  Pain was well controlled.  Tolerated diet.  Post op calcium level on morning following surgery was 9.3 mg/dl.  Patient was prepared for discharge home on POD#1.  Consults: None  Treatments: surgery: total thyroidectomy with limited node dissection  Discharge Exam: Blood pressure 113/63, pulse 74, temperature 97.9 F (36.6 C), temperature source Oral, resp. rate 16, weight 77.111 kg (170 lb), SpO2 100 %. HEENT - clear Neck - wound dry and intact; minimal STS; voice normal Chest - clear bilaterally Cor - RRR  Disposition: Home  Discharge Instructions    Apply dressing    Complete by:  As directed   Apply light gauze dressing to wound before discharge home today.     Diet - low sodium heart healthy    Complete by:  As directed      Discharge instructions    Complete by:  As directed   [facility]  THYROID & PARATHYROID SURGERY:  POST-OP INSTRUCTIONS  Always review your discharge instruction sheet from the facility where your surgery was performed.  A prescription for pain medication may be given to you upon discharge.  Take your pain medication as prescribed.  If narcotic pain medicine is not needed, then you may take acetaminophen (Tylenol) or ibuprofen (Advil) as needed.  Take your usually prescribed medications unless otherwise directed.  If you need a refill on your pain medication, please contact your pharmacy. They will contact our office to request authorization.  Prescriptions will not be  processed by our office after 5 pm or on weekends.  Start with a light diet upon arrival home, such as soup and crackers or toast.  Be sure to drink plenty of fluids daily.  Resume your normal diet the day after surgery.  Most patients will experience some swelling and bruising on the chest and neck area.  Ice packs will help.  Swelling and bruising can take several days to resolve.   It is common to experience some constipation after surgery.  Increasing fluid intake and taking a stool softener will usually help or prevent this problem.  A mild laxative (Milk of Magnesia or Miralax) should be taken according to package directions if there has been no bowel movement after 48 hours.  You have steri-strips and a gauze dressing over your incision.  You may remove the gauze bandage on the second day after surgery, and you may shower at that time.  Leave your steri-strips (small skin tapes) in place directly over the incision.  These strips should remain on the skin for 5-7 days and then be removed.  You may get them wet in the shower and pat them dry.  You may resume regular (light) daily activities beginning the next day - such as daily self-care, walking, climbing stairs - gradually increasing activities as tolerated.  You may have sexual intercourse when it is comfortable.  Refrain from any heavy lifting or straining until approved by your doctor.  You may drive when you no longer are taking prescription pain medication, you can comfortably wear a seatbelt, and you can safely maneuver your car and  apply brakes.  You should see your doctor in the office for a follow-up appointment approximately two to three weeks after your surgery.  Make sure that you call for this appointment within a day or two after you arrive home to insure a convenient appointment time.  WHEN TO CALL YOUR DOCTOR: -- Fever greater than 101.5 -- Inability to urinate -- Nausea and/or vomiting - persistent -- Extreme swelling or  bruising -- Continued bleeding from incision -- Increased pain, redness, or drainage from the incision -- Difficulty swallowing or breathing -- Muscle cramping or spasms -- Numbness or tingling in hands or around lips  The clinic staff is available to answer your questions during regular business hours.  Please don't hesitate to call and ask to speak to one of the nurses if you have concerns.  Earnstine Regal, MD, [facility] Office: [phone]  Website: [email or web address]     Increase activity slowly    Complete by:  As directed      Remove dressing in 24 hours    Complete by:  As directed      Suggamadex Discharge Instructions    Complete by:  As directed   During your recent anesthetic, you were given the medication sugammadex (Bridion). This medication interacts with hormonal forms of birth control (oral contraceptives and injected or implanted birth control) and may make them ineffective. IF YOU USE ANY HORMONAL FORM OF BIRTH CONTROL, YOU MUST USE AN ADDITIONAL BARRIER BIRTH CONTROL FOR METHOD FOR SEVEN DAYS after receiving sugammadex (Bridion) or there is a chance you could become pregnant.            Medication List    TAKE these medications        calcium carbonate 1250 (500 CA) MG tablet  Commonly known as:  OS-CAL - dosed in mg of elemental calcium  Take 2 tablets (1,000 mg of elemental calcium total) by mouth 2 (two) times daily with a meal.     HYDROcodone-acetaminophen 5-325 MG tablet  Commonly known as:  NORCO/VICODIN  Take 1-2 tablets by mouth every 4 (four) hours as needed for moderate pain.     ibuprofen 400 MG tablet  Commonly known as:  ADVIL,MOTRIN  Take 400 mg by mouth every 8 (eight) hours as needed for mild pain or moderate pain.     levonorgestrel 20 MCG/24HR IUD  Commonly known as:  MIRENA  1 each by Intrauterine route once.     meclizine 25 MG tablet  Commonly known as:   ANTIVERT  Take 25 mg by mouth 3 (three) times daily as needed for dizziness.     SYNTHROID 137 MCG tablet  Generic drug:  levothyroxine  Take 1 tablet (137 mcg total) by mouth daily before breakfast.     traMADol 50 MG tablet  Commonly known as:  ULTRAM  Take 50 mg by mouth every 6 (six) hours as needed for moderate pain.           Follow-up Information    Follow up with Earnstine Regal, MD. Schedule an appointment as soon as possible for a visit in 2 weeks.   Specialty:  General Surgery   Why:  For wound re-check   Contact information:   [place] [phone]       Earnstine Regal, MD, [facility] Office: [phone]   Signed: Earnstine Regal 08/29/2015, 8:49 AM

## 2015-08-30 ENCOUNTER — Encounter: Payer: Self-pay | Admitting: General Surgery

## 2015-08-30 NOTE — Progress Notes (Unsigned)
Her father called today because she is having some significant postop pain and had a small amount of bleeding from her neck incision after a cough.  She is s/p total thyroidectomy and limited central lymph nodes dissection for bilateral papillary thyroid cancer by Dr.Gerkin 08/28/15.  There is no active bleeding currently.  Some swelling is present but not a significant amount by report.  I recommended that she keep ice on the incision, stay at least 30 degrees upright, and take the pain medicine every 4 hours as needed.  If the swelling increases such that she has difficulty breathing or she has heavy bleeding from the incision, I advised him to take her to the ED.

## 2015-08-31 ENCOUNTER — Other Ambulatory Visit: Payer: Self-pay | Admitting: General Surgery

## 2015-08-31 MED ORDER — ONDANSETRON 4 MG PO TBDP: 4.0000 mg | ORAL_TABLET | Freq: Three times a day (TID) | ORAL | Status: AC | PRN

## 2015-08-31 NOTE — Progress Notes (Signed)
Pt's father called stating that the pt was having some nausea today.  She has taken some reg po with pain Rx and con't with nausea and no emesis. She's only taking 1 pain pill for pain.  Will send in some Zofran ODT to help with nausea at this time.

## 2015-09-02 ENCOUNTER — Other Ambulatory Visit: Payer: Self-pay | Admitting: Endocrinology

## 2015-09-02 DIAGNOSIS — C73 Malignant neoplasm of thyroid gland: Secondary | ICD-10-CM

## 2015-09-02 NOTE — Progress Notes (Signed)
Quick Note:  Please let patient know that since the tumor on the right side was slightly outside the thyroid will need to schedule radioactive iodine on her next visit. Order has been sent ______

## 2015-09-23 ENCOUNTER — Other Ambulatory Visit: Payer: Self-pay | Admitting: *Deleted

## 2015-09-23 ENCOUNTER — Telehealth: Payer: Self-pay | Admitting: Endocrinology

## 2015-09-23 MED ORDER — SYNTHROID 137 MCG PO TABS: 137.0000 ug | ORAL_TABLET | Freq: Every day | ORAL | Status: AC

## 2015-09-23 NOTE — Telephone Encounter (Signed)
rx sent

## 2015-09-23 NOTE — Telephone Encounter (Signed)
Patient need refill of synthroid, send to [facility]

## 2015-09-29 ENCOUNTER — Encounter (HOSPITAL_COMMUNITY): Payer: Managed Care, Other (non HMO)

## 2015-09-30 ENCOUNTER — Ambulatory Visit (HOSPITAL_COMMUNITY): Payer: Managed Care, Other (non HMO)

## 2015-10-01 ENCOUNTER — Encounter (HOSPITAL_COMMUNITY): Payer: Managed Care, Other (non HMO)

## 2015-10-03 ENCOUNTER — Ambulatory Visit: Payer: Managed Care, Other (non HMO) | Admitting: Endocrinology

## 2015-10-09 ENCOUNTER — Encounter (HOSPITAL_COMMUNITY): Payer: Managed Care, Other (non HMO)

## 2015-10-10 ENCOUNTER — Encounter (HOSPITAL_COMMUNITY): Payer: Managed Care, Other (non HMO)

## 2015-11-18 ENCOUNTER — Encounter: Payer: Self-pay | Admitting: Endocrinology
